# Patient Record
Sex: Female | Born: 1965 | Race: Black or African American | Hispanic: No | Smoking: Current every day smoker
Health system: Southern US, Community
[De-identification: ages and names within clinical notes are randomized; demographics above are authoritative.]

## PROBLEM LIST (undated history)

## (undated) DIAGNOSIS — J45909 Unspecified asthma, uncomplicated: Secondary | ICD-10-CM

## (undated) DIAGNOSIS — I10 Essential (primary) hypertension: Secondary | ICD-10-CM

## (undated) DIAGNOSIS — J329 Chronic sinusitis, unspecified: Secondary | ICD-10-CM

## (undated) DIAGNOSIS — E7800 Pure hypercholesterolemia, unspecified: Secondary | ICD-10-CM

## (undated) HISTORY — PX: KNEE SURGERY: SHX244

## (undated) HISTORY — PX: TUBAL LIGATION: SHX77

## (undated) HISTORY — PX: NASAL SINUS SURGERY: SHX719

---

## 2005-06-12 ENCOUNTER — Emergency Department (HOSPITAL_COMMUNITY): Admission: EM | Admit: 2005-06-12 | Discharge: 2005-06-12 | Payer: Self-pay | Admitting: Emergency Medicine

## 2006-01-14 ENCOUNTER — Emergency Department (HOSPITAL_COMMUNITY): Admission: EM | Admit: 2006-01-14 | Discharge: 2006-01-14 | Payer: Self-pay | Admitting: Emergency Medicine

## 2006-03-04 ENCOUNTER — Emergency Department (HOSPITAL_COMMUNITY): Admission: EM | Admit: 2006-03-04 | Discharge: 2006-03-04 | Payer: Self-pay | Admitting: Emergency Medicine

## 2006-05-14 IMAGING — CT CT ABDOMEN W/ CM
2 of 7 series · 15 of 44 positions shown, 19 images · IV contrast (ORAL OMNI 350 & 100 ML OMNI 300)
Comparison: none

CLINICAL DATA: Severe left lower quadrant pain with leukocytosis, nausea and vomiting.
 ABDOMEN CT WITH CONTRAST:
TECHNIQUE: Multidetector CT imaging of the abdomen was performed following the standard protocol during bolus administration of intravenous contrast.
 Contrast:  100 cc Omnipaque 300.  Oral contrast was given.
TECHNIQUE: Multidetector CT imaging of the pelvis was performed following the standard protocol during bolus administration of intravenous contrast.
 No pelvic mass, fluid collection, or inflammatory process is seen.  Air extends into the distal colon, and there is no evidence of diverticulitis or appendicitis.  The uterus and adnexa appear unremarkable.

[Series 4: recon 3: routine abdomen · axial · 0.73mm/px · z∈[-448,-68]mm · 12 of 352 slices shown, 16 images]
[im 32/352  soft-tissue]
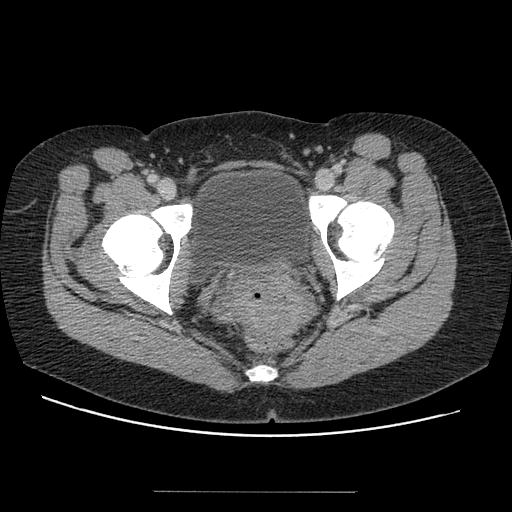
[im 32/352  bone]
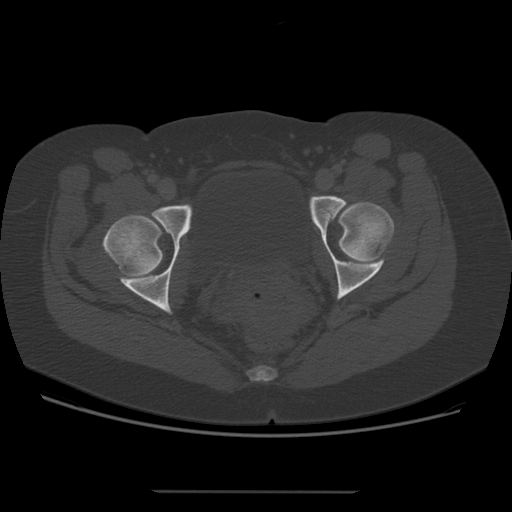
[im 64/352  soft-tissue]
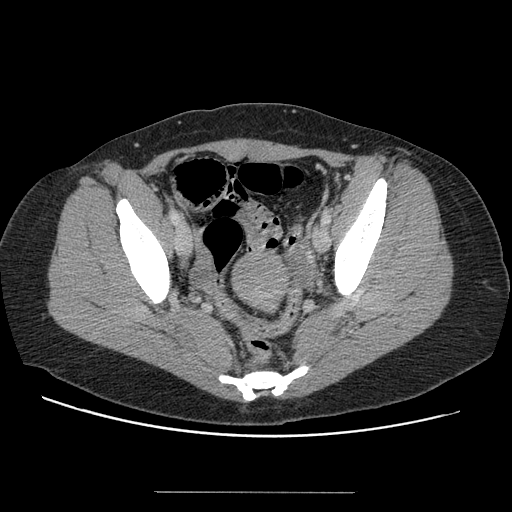
[im 96/352  soft-tissue]
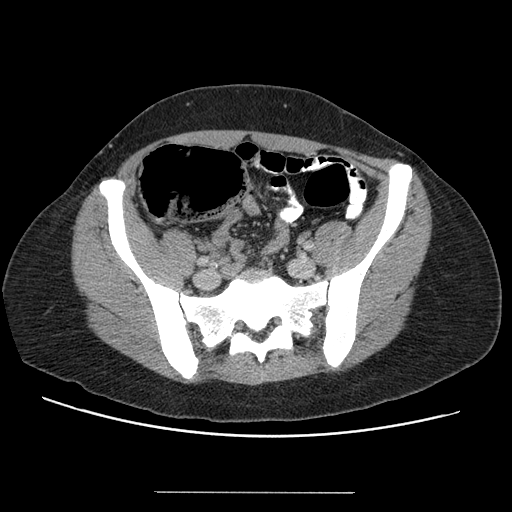
[im 128/352  soft-tissue]
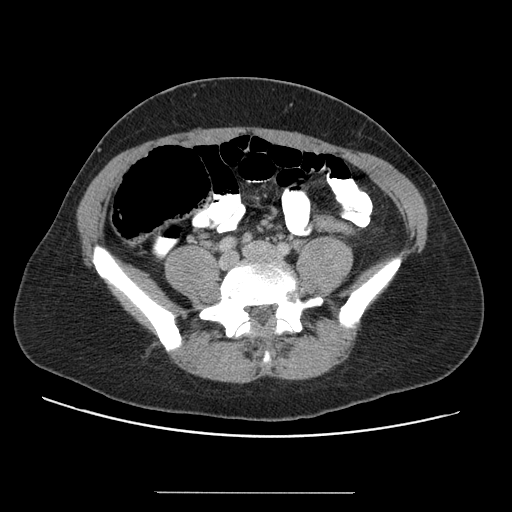
[im 160/352  soft-tissue]
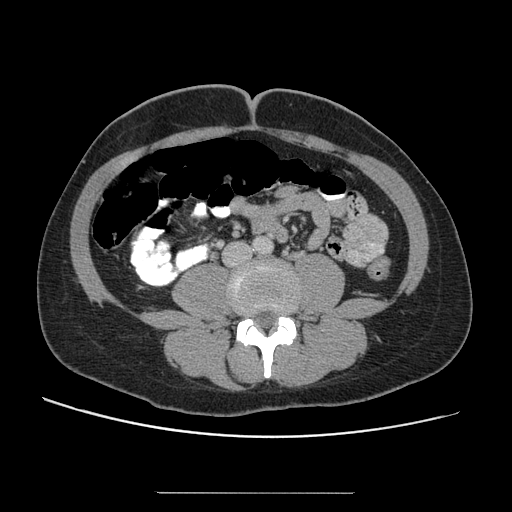
[im 192/352  soft-tissue]
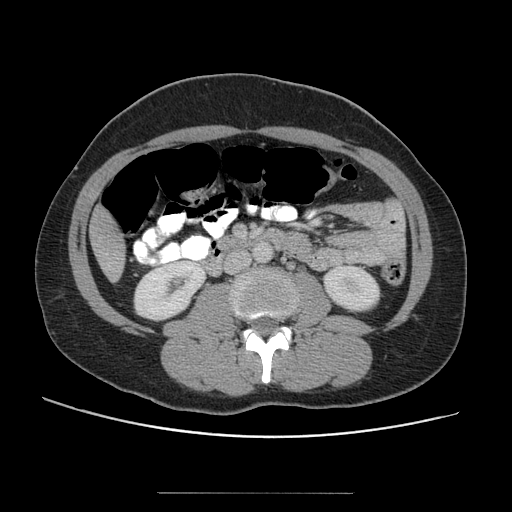
[im 224/352  soft-tissue]
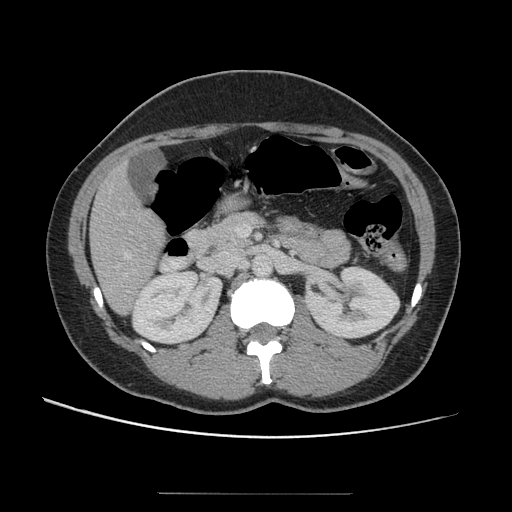
[im 256/352  soft-tissue]
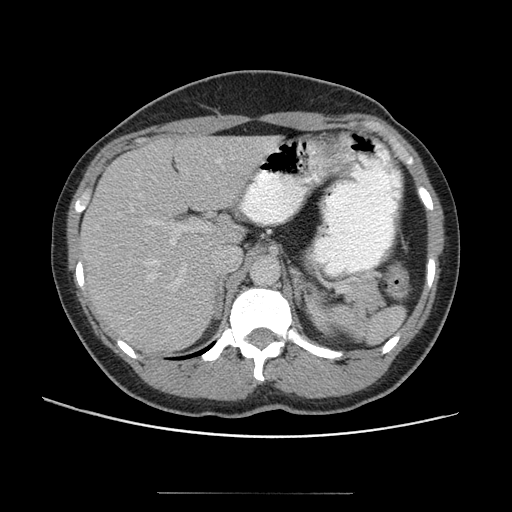
[im 288/352  soft-tissue]
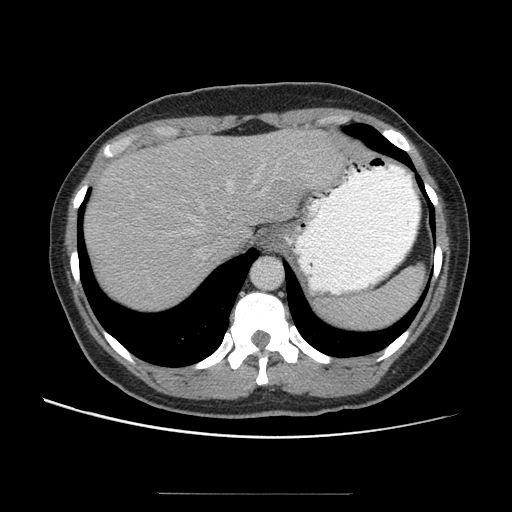
[im 288/352  lung]
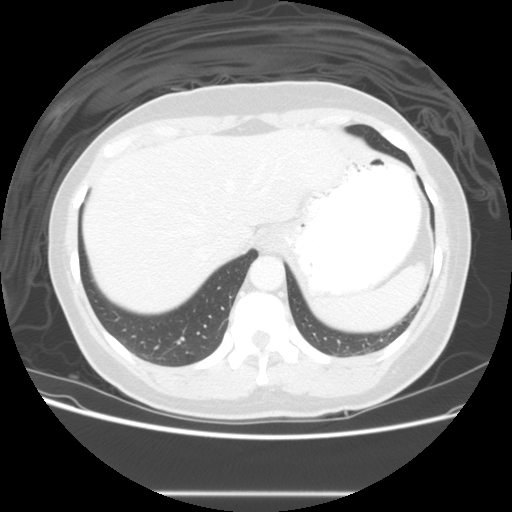
[im 288/352  bone]
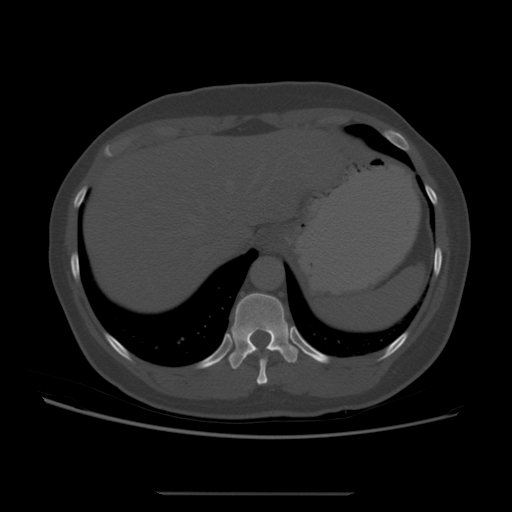
[im 304/352  lung]
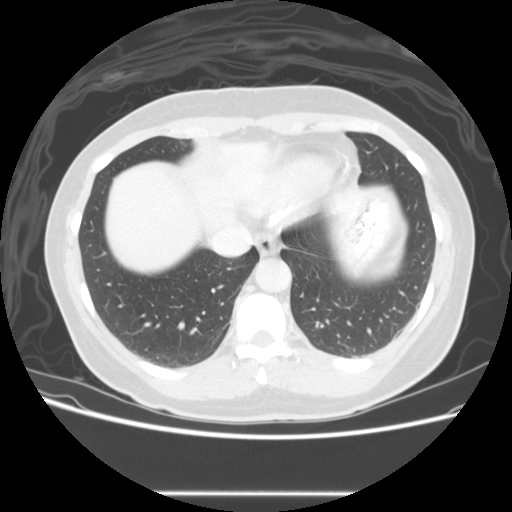
[im 320/352  soft-tissue]
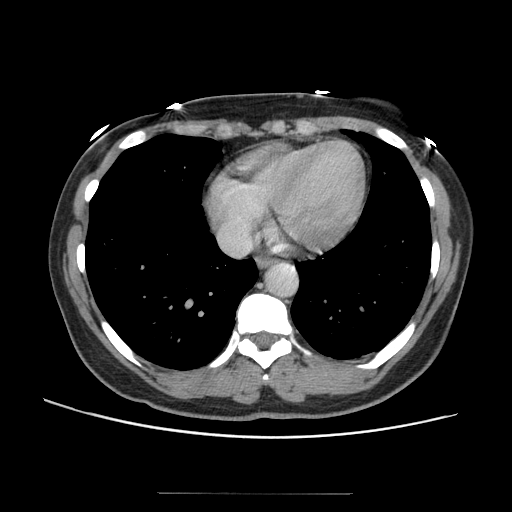
[im 320/352  lung]
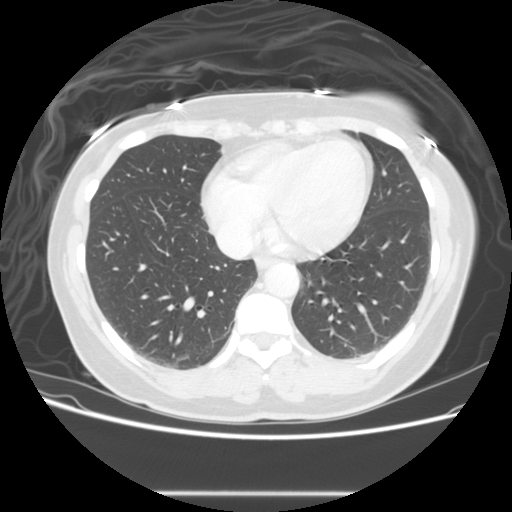
[im 336/352  lung]
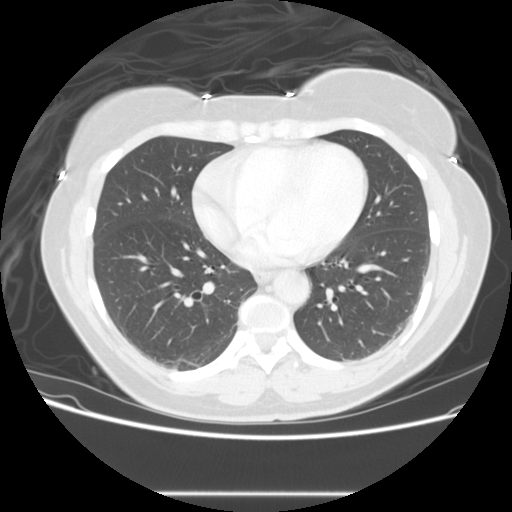

[Series 401: reformatted · coronal · 0.27mm/px · 3 of 49 slices shown]
[im 17/49  soft-tissue]
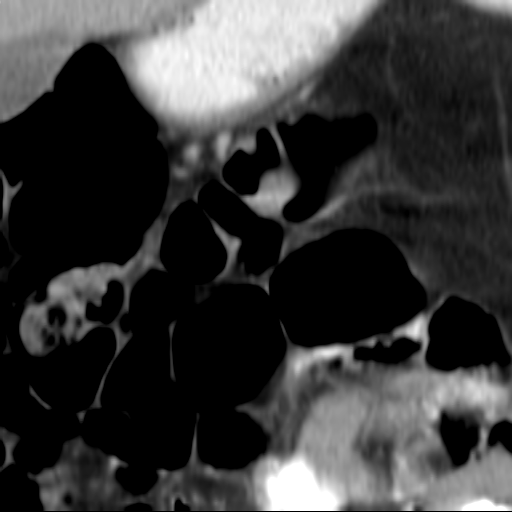
[im 22/49  soft-tissue]
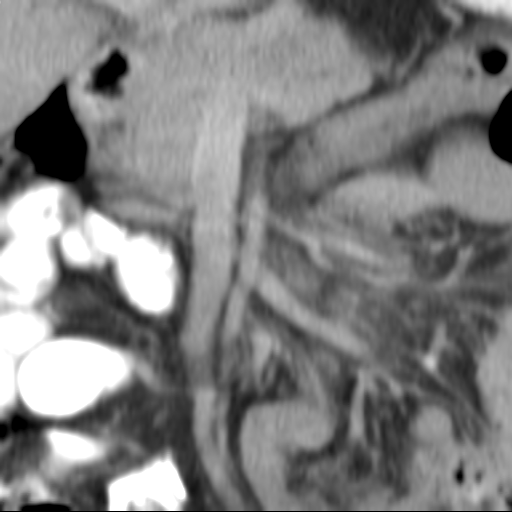
[im 27/49  soft-tissue]
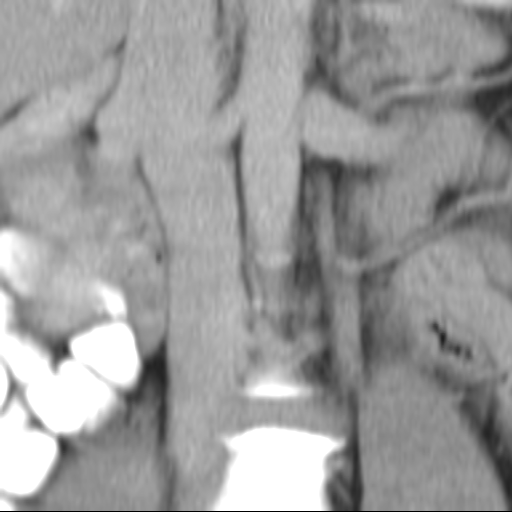

[15 of 44 positions shown; findings below may reference images not displayed]

FINDINGS: The lung bases are clear.  There is no pleural effusion.  The liver, spleen, gallbladder, pancreas, adrenal glands and kidneys appear normal.  Air is present throughout the colon.  The bowel gas pattern is nonobstructive.  There is no free intraperitoneal air, ascites, or inflammatory change.
IMPRESSION: No acute abdominal findings.  Bowel gas pattern is nonobstructive. 
 PELVIS CT WITH CONTRAST:
IMPRESSION: No acute pelvic findings.  No evidence of pelvic abscess or focal inflammatory process.

## 2006-08-07 ENCOUNTER — Emergency Department (HOSPITAL_COMMUNITY): Admission: EM | Admit: 2006-08-07 | Discharge: 2006-08-07 | Payer: Self-pay | Admitting: Emergency Medicine

## 2013-04-16 ENCOUNTER — Emergency Department (HOSPITAL_COMMUNITY)
Admission: EM | Admit: 2013-04-16 | Discharge: 2013-04-16 | Disposition: A | Discharge status: Home / Self Care | Payer: Self-pay | Attending: Emergency Medicine | Admitting: Emergency Medicine

## 2013-04-16 ENCOUNTER — Encounter (HOSPITAL_COMMUNITY): Payer: Self-pay | Admitting: Emergency Medicine

## 2013-04-16 DIAGNOSIS — H9209 Otalgia, unspecified ear: Secondary | ICD-10-CM | POA: Insufficient documentation

## 2013-04-16 DIAGNOSIS — J329 Chronic sinusitis, unspecified: Secondary | ICD-10-CM | POA: Insufficient documentation

## 2013-04-16 DIAGNOSIS — Z9889 Other specified postprocedural states: Secondary | ICD-10-CM | POA: Insufficient documentation

## 2013-04-16 DIAGNOSIS — E119 Type 2 diabetes mellitus without complications: Secondary | ICD-10-CM | POA: Insufficient documentation

## 2013-04-16 DIAGNOSIS — J3489 Other specified disorders of nose and nasal sinuses: Secondary | ICD-10-CM | POA: Insufficient documentation

## 2013-04-16 DIAGNOSIS — Z862 Personal history of diseases of the blood and blood-forming organs and certain disorders involving the immune mechanism: Secondary | ICD-10-CM | POA: Insufficient documentation

## 2013-04-16 DIAGNOSIS — R059 Cough, unspecified: Secondary | ICD-10-CM | POA: Insufficient documentation

## 2013-04-16 DIAGNOSIS — Z8639 Personal history of other endocrine, nutritional and metabolic disease: Secondary | ICD-10-CM | POA: Insufficient documentation

## 2013-04-16 DIAGNOSIS — R51 Headache: Secondary | ICD-10-CM | POA: Insufficient documentation

## 2013-04-16 DIAGNOSIS — R05 Cough: Secondary | ICD-10-CM | POA: Insufficient documentation

## 2013-04-16 DIAGNOSIS — F17200 Nicotine dependence, unspecified, uncomplicated: Secondary | ICD-10-CM | POA: Insufficient documentation

## 2013-04-16 HISTORY — DX: Chronic sinusitis, unspecified: J32.9

## 2013-04-16 HISTORY — DX: Pure hypercholesterolemia, unspecified: E78.00

## 2013-04-16 MED ORDER — PRAVASTATIN SODIUM 10 MG PO TABS: 10.0000 mg | ORAL_TABLET | Freq: Every day | ORAL | Status: DC

## 2013-04-16 MED ORDER — HYDROCODONE-ACETAMINOPHEN 5-325 MG PO TABS: 1.0000 | ORAL_TABLET | Freq: Four times a day (QID) | ORAL | Status: DC | PRN

## 2013-04-16 MED ORDER — HYDROCODONE-ACETAMINOPHEN 5-325 MG PO TABS
ORAL_TABLET | ORAL | Status: AC
  Filled 2013-04-16: qty 1

## 2013-04-16 MED ORDER — HYDROCHLOROTHIAZIDE 25 MG PO TABS: 25.0000 mg | ORAL_TABLET | Freq: Every day | ORAL | Status: DC

## 2013-04-16 MED ORDER — AMOXICILLIN 500 MG PO CAPS: 500.0000 mg | ORAL_CAPSULE | Freq: Three times a day (TID) | ORAL | Status: DC

## 2013-04-16 MED ORDER — HYDROCODONE-ACETAMINOPHEN 5-325 MG PO TABS
1.0000 | ORAL_TABLET | Freq: Once | ORAL | Status: AC
  Administered 2013-04-16: 1 via ORAL

## 2013-04-16 NOTE — ED Provider Notes (Signed)
History    This chart was scribed for Benny Lennert, MD by Gerlean Ren, ED Scribe. This patient was seen in room [facility] and the patient's care was started at 5:22 PM    CSN: [id number]  Arrival date & time 04/16/13  1653   First MD Initiated Contact with Patient 04/16/13 1716      Chief Complaint  Patient presents with  . Recurrent Sinusitis     The history is provided by the patient. No language interpreter was used.  Jocelyn Perry is a 47 y.o. female with h/o DM who presents to the Emergency Department complaining of a sinus infection with sinus pressure, HA, left side otalgia, cough, rhinorrhea.  Pt reports h/o recurrent sinus infections since having surgery on sinuses many years ago.  Pt reports most recent sinus infection 4 months ago but is unsure of what antibiotic she was given.  Pt denies associated fever, sneezing.  Pt is a current everyday smoker (0.5pack/day).  Past Medical History  Diagnosis Date  . Sinus infection   . Diabetes mellitus without complication   . High cholesterol     Past Surgical History  Procedure Laterality Date  . Knee surgery Right   . Tubal ligation    . Nasal sinus surgery      Family History  Problem Relation Age of Onset  . Diabetes Mother   . Cancer Mother   . Hypertension Father   . Stroke Sister     History  Substance Use Topics  . Smoking status: Current Every Day Smoker -- 0.50 packs/day for 12 years    Types: Cigarettes  . Smokeless tobacco: Never Used  . Alcohol Use: No    OB History   Grav Para Term Preterm Abortions TAB SAB Ect Mult Living   3 3 3       3       Review of Systems  Constitutional: Negative for fever.  HENT: Positive for ear pain and sinus pressure. Negative for sneezing.   Respiratory: Positive for cough.   Neurological: Positive for headaches.  All other systems reviewed and are negative.    Allergies  Review of patient's allergies indicates no known allergies.  Home Medications   No current outpatient prescriptions on file.  BP 162/110  Pulse 102  Temp(Src) 98.3 F (36.8 C) (Oral)  Resp 18  Ht 5\' 7"  (1.702 m)  Wt 182 lb (82.555 kg)  BMI 28.5 kg/m2  SpO2 100%  Physical Exam  Nursing note and vitals reviewed. Constitutional: She is oriented to person, place, and time. She appears well-developed.  HENT:  Head: Normocephalic.  Tenderness over left maxillary sinus  Eyes: Conjunctivae are normal.  Neck: No tracheal deviation present.  Cardiovascular:  No murmur heard. Pulmonary/Chest: Effort normal. No respiratory distress.  Musculoskeletal: Normal range of motion.  Neurological: She is oriented to person, place, and time.  Skin: Skin is warm.  Psychiatric: She has a normal mood and affect.    ED Course  Procedures (including critical care time) DIAGNOSTIC STUDIES: Oxygen Saturation is 100% on room air, normal by my interpretation.    COORDINATION OF CARE: 5:25 PM- Patient informed of clinical course, understands medical decision-making process, and agrees with plan.  Labs Reviewed - No data to display No results found.   No diagnosis found.    MDM        The chart was scribed for me under my direct supervision.  I personally performed the history, physical, and medical decision  making and all procedures in the evaluation of this patient.Benny Lennert, MD 04/16/13 [phone]

## 2013-04-16 NOTE — ED Notes (Addendum)
Patient c/o sinus infection. Per patient has had multiple sinus infections in past with surgery on sinus. Patient reports sinus pressure, headache, and left ear.

## 2013-04-16 NOTE — ED Notes (Signed)
Instructions, prescriptions and f/u information given/reviewed - verbalizes understanding.  

## 2014-10-06 ENCOUNTER — Encounter (HOSPITAL_COMMUNITY): Payer: Self-pay | Admitting: Emergency Medicine

## 2016-10-07 ENCOUNTER — Encounter: Payer: Self-pay | Admitting: Emergency Medicine

## 2016-10-07 ENCOUNTER — Emergency Department
Admission: EM | Admit: 2016-10-07 | Discharge: 2016-10-07 | Disposition: A | Discharge status: Home / Self Care | Payer: Commercial Managed Care - HMO | Attending: Emergency Medicine | Admitting: Emergency Medicine

## 2016-10-07 DIAGNOSIS — F17210 Nicotine dependence, cigarettes, uncomplicated: Secondary | ICD-10-CM | POA: Diagnosis not present

## 2016-10-07 DIAGNOSIS — J0100 Acute maxillary sinusitis, unspecified: Secondary | ICD-10-CM | POA: Diagnosis not present

## 2016-10-07 DIAGNOSIS — E119 Type 2 diabetes mellitus without complications: Secondary | ICD-10-CM | POA: Diagnosis not present

## 2016-10-07 DIAGNOSIS — I10 Essential (primary) hypertension: Secondary | ICD-10-CM | POA: Insufficient documentation

## 2016-10-07 DIAGNOSIS — Z79899 Other long term (current) drug therapy: Secondary | ICD-10-CM | POA: Insufficient documentation

## 2016-10-07 DIAGNOSIS — Z792 Long term (current) use of antibiotics: Secondary | ICD-10-CM | POA: Insufficient documentation

## 2016-10-07 DIAGNOSIS — R0981 Nasal congestion: Secondary | ICD-10-CM | POA: Diagnosis present

## 2016-10-07 MED ORDER — LEVOFLOXACIN 750 MG PO TABS: 750.0000 mg | ORAL_TABLET | Freq: Every day | ORAL | 0 refills | Status: AC

## 2016-10-07 MED ORDER — HYDROCOD POLST-CPM POLST ER 10-8 MG/5ML PO SUER: 5.0000 mL | Freq: Every evening | ORAL | 0 refills | Status: DC | PRN

## 2016-10-07 MED ORDER — FEXOFENADINE-PSEUDOEPHED ER 60-120 MG PO TB12: 1.0000 | ORAL_TABLET | Freq: Two times a day (BID) | ORAL | 0 refills | Status: DC

## 2016-10-07 MED ORDER — BENZONATATE 100 MG PO CAPS: 100.0000 mg | ORAL_CAPSULE | Freq: Three times a day (TID) | ORAL | 0 refills | Status: DC | PRN

## 2016-10-07 NOTE — ED Provider Notes (Signed)
[facility] Emergency Department Provider Note   ____________________________________________   First MD Initiated Contact with Patient 10/07/16 [phone]     (approximate)  I have reviewed the triage vital signs and the nursing notes.   HISTORY  Chief Complaint URI    HPI Jocelyn Perry is a 50 y.o. female patient complain of 1 week of nasal congestion and left ear pain. Patient states she has a history of recurrent sinus infections. Patient denies any fever or chills at this time. Patient states  fever and chills over the weekend but they have resolved.  Patient rates her pain discomfort as 8/10.Patient states she has a thick nasal discharge and postnasal drainage. Patient said the postnasal drainage is causing a nonproductive cough. Patient states cough is worse at night. No palliative measures taken for this complaint.  Past Medical History:  Diagnosis Date  . Diabetes mellitus without complication (HCC)   . High cholesterol   . Sinus infection     There are no active problems to display for this patient.   Past Surgical History:  Procedure Laterality Date  . KNEE SURGERY Right   . NASAL SINUS SURGERY    . TUBAL LIGATION      Prior to Admission medications   Medication Sig Start Date End Date Taking? Authorizing Provider  amoxicillin (AMOXIL) 500 MG capsule Take 1 capsule (500 mg total) by mouth 3 (three) times daily. 04/16/13   Bethann BerkshireJoseph Zammit, MD  benzonatate (TESSALON PERLES) 100 MG capsule Take 1 capsule (100 mg total) by mouth 3 (three) times daily as needed for cough. 10/07/16 10/07/17  Joni Reiningonald K Smith, PA-C  chlorpheniramine-HYDROcodone [facility](TUSSIONEX PENNKINETIC ER) 10-8 MG/5ML SUER Take 5 mLs by mouth at bedtime as needed for cough. 10/07/16   Joni Reiningonald K Smith, PA-C  fexofenadine-pseudoephedrine (ALLEGRA-D) 60-120 MG 12 hr tablet Take 1 tablet by mouth 2 (two) times daily. 10/07/16   Joni Reiningonald K Smith, PA-C  hydrochlorothiazide (HYDRODIURIL) 25 MG  tablet Take 1 tablet (25 mg total) by mouth daily. 04/16/13   Bethann BerkshireJoseph Zammit, MD  hydrochlorothiazide (MICROZIDE) 12.5 MG capsule Take 12.5 mg by mouth daily.    Historical Provider, MD  HYDROcodone-acetaminophen (NORCO/VICODIN) 5-325 MG per tablet Take 1 tablet by mouth every 6 (six) hours as needed for pain. 04/16/13   Bethann BerkshireJoseph Zammit, MD  ibuprofen (ADVIL,MOTRIN) 200 MG tablet Take 200 mg by mouth every 6 (six) hours as needed for pain.    Historical Provider, MD  levofloxacin (LEVAQUIN) 750 MG tablet Take 1 tablet (750 mg total) by mouth daily. 10/07/16 10/14/16  Joni Reiningonald K Smith, PA-C  pravastatin (PRAVACHOL) 10 MG tablet Take 1 tablet (10 mg total) by mouth daily. 04/16/13   Bethann BerkshireJoseph Zammit, MD    Allergies Review of patient's allergies indicates no known allergies.  Family History  Problem Relation Age of Onset  . Diabetes Mother   . Cancer Mother   . Hypertension Father   . Stroke Sister     Social History Social History  Substance Use Topics  . Smoking status: Current Every Day Smoker    Packs/day: 0.50    Years: 12.00    Types: Cigarettes  . Smokeless tobacco: Never Used  . Alcohol use No    Review of Systems Constitutional: No fever/chills Eyes: No visual changes. ENT: No sore throat. Cardiovascular: Denies chest pain. Respiratory: Denies shortness of breath. Gastrointestinal: No abdominal pain.  No nausea, no vomiting.  No diarrhea.  No constipation. Genitourinary: Negative for dysuria. Musculoskeletal: Negative for  back pain. Skin: Negative for rash. Neurological: Negative for headaches, focal weakness or numbness. Endocrine:Hypertension  ____________________________________________   PHYSICAL EXAM:  VITAL SIGNS: ED Triage Vitals [10/07/16 0818]  Enc Vitals Group     BP (!) 177/106     Pulse Rate 94     Resp 18     Temp 98.2 F (36.8 C)     Temp Source Oral     SpO2 99 %     Weight 185 lb (83.9 kg)     Height 5\' 7"  (1.702 m)     Head Circumference       Peak Flow      Pain Score 8     Pain Loc      Pain Edu?      Excl. in GC?     Constitutional: Alert and oriented. Well appearing and in no acute distress. Eyes: Conjunctivae are normal. PERRL. EOMI. Head: Atraumatic. Nose: Left maxillary guarding. Edematous nasal turbinates with thick nasal discharge. Patient is edematous bilateral TMs. Mouth/Throat: Mucous membranes are moist.  Oropharynx non-erythematous. Postnasal drainage Neck: No stridor.  No cervical spine tenderness to palpation. Hematological/Lymphatic/Immunilogical: No cervical lymphadenopathy. Cardiovascular: Normal rate, regular rhythm. Grossly normal heart sounds.  Good peripheral circulation. Elevated blood pressure but admits she did not take a blood pressure medicine this morning. Respiratory: Normal respiratory effort.  No retractions. Lungs CTAB. Gastrointestinal: Soft and nontender. No distention. No abdominal bruits. No CVA tenderness. Musculoskeletal: No lower extremity tenderness nor edema.  No joint effusions. Neurologic:  Normal speech and language. No gross focal neurologic deficits are appreciated. No gait instability. Skin:  Skin is warm, dry and intact. No rash noted. Psychiatric: Mood and affect are normal. Speech and behavior are normal.  ____________________________________________   LABS (all labs ordered are listed, but only abnormal results are displayed)  Labs Reviewed - No data to display ____________________________________________  EKG   ____________________________________________  RADIOLOGY   ____________________________________________   PROCEDURES  Procedure(s) performed: None  Procedures  Critical Care performed: No  ____________________________________________   INITIAL IMPRESSION / ASSESSMENT AND PLAN / ED COURSE  Pertinent labs & imaging results that were available during my care of the patient were reviewed by me and considered in my medical decision making (see chart  for details).  Left maxillary sinusitis. Patient given discharge Instructions. Patient given a prescription for Levaquin, Allegra-D, Tessalon, and Tussionex.  Clinical Course     ____________________________________________   FINAL CLINICAL IMPRESSION(S) / ED DIAGNOSES  Final diagnoses:  Subacute maxillary sinusitis      NEW MEDICATIONS STARTED DURING THIS VISIT:  New Prescriptions   BENZONATATE (TESSALON PERLES) 100 MG CAPSULE    Take 1 capsule (100 mg total) by mouth 3 (three) times daily as needed for cough.   CHLORPHENIRAMINE-HYDROCODONE (TUSSIONEX PENNKINETIC ER) 10-8 MG/5ML SUER    Take 5 mLs by mouth at bedtime as needed for cough.   FEXOFENADINE-PSEUDOEPHEDRINE (ALLEGRA-D) 60-120 MG 12 HR TABLET    Take 1 tablet by mouth 2 (two) times daily.   LEVOFLOXACIN (LEVAQUIN) 750 MG TABLET    Take 1 tablet (750 mg total) by mouth daily.     Note:  This document was prepared using Dragon voice recognition software and may include unintentional dictation errors.    Joni Reiningonald K Smith, PA-C 10/07/16 [phone]    Jeanmarie PlantJames A McShane, MD 10/07/16 [phone]

## 2016-10-07 NOTE — ED Notes (Signed)
Pt to ed with c/o pressure and drainage from nose and throat x 3 days.  Pt states she has tried otcs without relief. Denies fever. Reports cough worse at night.

## 2016-10-07 NOTE — ED Triage Notes (Signed)
Pt presents with nasal congestion and ear pain for over one week. Pt states she gets frequent sinus infection.

## 2016-11-06 ENCOUNTER — Emergency Department: Payer: Commercial Managed Care - HMO | Admitting: Anesthesiology

## 2016-11-06 ENCOUNTER — Observation Stay
Admission: EM | Admit: 2016-11-06 | Discharge: 2016-11-07 | Disposition: A | Discharge status: Home / Self Care | Payer: Commercial Managed Care - HMO | Attending: Surgery | Admitting: Surgery

## 2016-11-06 ENCOUNTER — Emergency Department: Payer: Commercial Managed Care - HMO

## 2016-11-06 ENCOUNTER — Encounter: Payer: Self-pay | Admitting: Emergency Medicine

## 2016-11-06 ENCOUNTER — Other Ambulatory Visit: Payer: Self-pay

## 2016-11-06 ENCOUNTER — Encounter
Admission: EM | Disposition: A | Discharge status: Home / Self Care | Payer: Self-pay | Source: Home / Self Care | Attending: Emergency Medicine

## 2016-11-06 DIAGNOSIS — K352 Acute appendicitis with generalized peritonitis, without abscess: Secondary | ICD-10-CM | POA: Diagnosis present

## 2016-11-06 DIAGNOSIS — F17210 Nicotine dependence, cigarettes, uncomplicated: Secondary | ICD-10-CM | POA: Insufficient documentation

## 2016-11-06 DIAGNOSIS — E119 Type 2 diabetes mellitus without complications: Secondary | ICD-10-CM | POA: Insufficient documentation

## 2016-11-06 DIAGNOSIS — Z833 Family history of diabetes mellitus: Secondary | ICD-10-CM | POA: Insufficient documentation

## 2016-11-06 DIAGNOSIS — Z808 Family history of malignant neoplasm of other organs or systems: Secondary | ICD-10-CM | POA: Insufficient documentation

## 2016-11-06 DIAGNOSIS — J45909 Unspecified asthma, uncomplicated: Secondary | ICD-10-CM

## 2016-11-06 DIAGNOSIS — I700 Atherosclerosis of aorta: Secondary | ICD-10-CM | POA: Insufficient documentation

## 2016-11-06 DIAGNOSIS — K35209 Acute appendicitis with generalized peritonitis, without abscess, unspecified as to perforation: Secondary | ICD-10-CM | POA: Diagnosis present

## 2016-11-06 DIAGNOSIS — Z823 Family history of stroke: Secondary | ICD-10-CM | POA: Insufficient documentation

## 2016-11-06 DIAGNOSIS — K353 Acute appendicitis with localized peritonitis, without perforation or gangrene: Secondary | ICD-10-CM

## 2016-11-06 DIAGNOSIS — E7800 Pure hypercholesterolemia, unspecified: Secondary | ICD-10-CM | POA: Insufficient documentation

## 2016-11-06 DIAGNOSIS — Z8249 Family history of ischemic heart disease and other diseases of the circulatory system: Secondary | ICD-10-CM | POA: Insufficient documentation

## 2016-11-06 DIAGNOSIS — K3580 Unspecified acute appendicitis: Secondary | ICD-10-CM | POA: Diagnosis present

## 2016-11-06 DIAGNOSIS — K5730 Diverticulosis of large intestine without perforation or abscess without bleeding: Secondary | ICD-10-CM | POA: Insufficient documentation

## 2016-11-06 DIAGNOSIS — I10 Essential (primary) hypertension: Secondary | ICD-10-CM | POA: Insufficient documentation

## 2016-11-06 HISTORY — DX: Essential (primary) hypertension: I10

## 2016-11-06 HISTORY — PX: LAPAROSCOPIC APPENDECTOMY: SHX408

## 2016-11-06 HISTORY — DX: Unspecified asthma, uncomplicated: J45.909

## 2016-11-06 LAB — URINALYSIS COMPLETE WITH MICROSCOPIC (ARMC ONLY)
Bilirubin Urine: NEGATIVE
Glucose, UA: NEGATIVE mg/dL
Hgb urine dipstick: NEGATIVE
KETONES UR: NEGATIVE mg/dL
Leukocytes, UA: NEGATIVE
Nitrite: NEGATIVE
PROTEIN: NEGATIVE mg/dL
Specific Gravity, Urine: 1.018 (ref 1.005–1.030)
pH: 5 (ref 5.0–8.0)

## 2016-11-06 LAB — COMPREHENSIVE METABOLIC PANEL
ALBUMIN: 4.4 g/dL (ref 3.5–5.0)
ALK PHOS: 121 U/L (ref 38–126)
ALT: 14 U/L (ref 14–54)
ANION GAP: 9 (ref 5–15)
AST: 19 U/L (ref 15–41)
BUN: 10 mg/dL (ref 6–20)
CALCIUM: 9.7 mg/dL (ref 8.9–10.3)
CO2: 26 mmol/L (ref 22–32)
CREATININE: 0.87 mg/dL (ref 0.44–1.00)
Chloride: 103 mmol/L (ref 101–111)
GFR calc Af Amer: >60 mL/min (ref >60)
GFR calc non Af Amer: >60 mL/min (ref >60)
GLUCOSE: 108 mg/dL — AB (ref 65–99)
Potassium: 4.2 mmol/L (ref 3.5–5.1)
SODIUM: 138 mmol/L (ref 135–145)
Total Bilirubin: 0.7 mg/dL (ref 0.3–1.2)
Total Protein: 8.4 g/dL — ABNORMAL HIGH (ref 6.5–8.1)

## 2016-11-06 LAB — LACTIC ACID, PLASMA: LACTIC ACID, VENOUS: 1.2 mmol/L (ref 0.5–1.9)

## 2016-11-06 LAB — CBC
HCT: 50 % — ABNORMAL HIGH (ref 35.0–47.0)
HEMOGLOBIN: 16.3 g/dL — AB (ref 12.0–16.0)
MCH: 27.3 pg (ref 26.0–34.0)
MCHC: 32.7 g/dL (ref 32.0–36.0)
MCV: 83.4 fL (ref 80.0–100.0)
Platelets: 382 10*3/uL (ref 150–440)
RBC: 5.99 MIL/uL — ABNORMAL HIGH (ref 3.80–5.20)
RDW: 14.1 % (ref 11.5–14.5)
WBC: 18.1 10*3/uL — ABNORMAL HIGH (ref 3.6–11.0)

## 2016-11-06 LAB — TROPONIN I

## 2016-11-06 LAB — LIPASE, BLOOD: Lipase: 19 U/L (ref 11–51)

## 2016-11-06 SURGERY — APPENDECTOMY, LAPAROSCOPIC
Anesthesia: General | Wound class: Clean Contaminated

## 2016-11-06 MED ORDER — MORPHINE SULFATE (PF) 4 MG/ML IV SOLN
4.0000 mg | Freq: Once | INTRAVENOUS | Status: AC
  Administered 2016-11-06: 4 mg via INTRAVENOUS
  Filled 2016-11-06: qty 1

## 2016-11-06 MED ORDER — SUCCINYLCHOLINE CHLORIDE 20 MG/ML IJ SOLN
INTRAMUSCULAR | Status: DC | PRN
  Administered 2016-11-06: 100 mg via INTRAVENOUS

## 2016-11-06 MED ORDER — PIPERACILLIN-TAZOBACTAM 3.375 G IVPB
INTRAVENOUS | Status: AC
  Administered 2016-11-06: 3.375 g via INTRAVENOUS
  Filled 2016-11-06: qty 50

## 2016-11-06 MED ORDER — SODIUM CHLORIDE 0.9 % IV BOLUS (SEPSIS)
1000.0000 mL | INTRAVENOUS | Status: AC
  Administered 2016-11-06: 1000 mL via INTRAVENOUS

## 2016-11-06 MED ORDER — ONDANSETRON HCL 4 MG/2ML IJ SOLN: 4.0000 mg | Freq: Once | INTRAMUSCULAR | Status: DC | PRN

## 2016-11-06 MED ORDER — ROCURONIUM BROMIDE 100 MG/10ML IV SOLN
INTRAVENOUS | Status: DC | PRN
  Administered 2016-11-06: 30 mg via INTRAVENOUS

## 2016-11-06 MED ORDER — LIDOCAINE HCL (CARDIAC) 20 MG/ML IV SOLN
INTRAVENOUS | Status: DC | PRN
  Administered 2016-11-06: 50 mg via INTRAVENOUS

## 2016-11-06 MED ORDER — BUPIVACAINE HCL (PF) 0.5 % IJ SOLN
INTRAMUSCULAR | Status: AC
  Filled 2016-11-06: qty 30

## 2016-11-06 MED ORDER — MORPHINE SULFATE (PF) 4 MG/ML IV SOLN
INTRAVENOUS | Status: AC
  Administered 2016-11-06: 4 mg via INTRAVENOUS
  Filled 2016-11-06: qty 1

## 2016-11-06 MED ORDER — ONDANSETRON HCL 4 MG/2ML IJ SOLN
INTRAMUSCULAR | Status: DC | PRN
  Administered 2016-11-06: 4 mg via INTRAVENOUS

## 2016-11-06 MED ORDER — ACETAMINOPHEN 500 MG PO TABS
1000.0000 mg | ORAL_TABLET | Freq: Four times a day (QID) | ORAL | Status: DC
  Administered 2016-11-06 – 2016-11-07 (×2): 1000 mg via ORAL
  Filled 2016-11-06 (×2): qty 2

## 2016-11-06 MED ORDER — KETOROLAC TROMETHAMINE 30 MG/ML IJ SOLN
30.0000 mg | Freq: Four times a day (QID) | INTRAMUSCULAR | Status: DC
  Administered 2016-11-06 – 2016-11-07 (×2): 30 mg via INTRAVENOUS
  Filled 2016-11-06 (×2): qty 1

## 2016-11-06 MED ORDER — PIPERACILLIN-TAZOBACTAM 3.375 G IVPB 30 MIN
3.3750 g | Freq: Once | INTRAVENOUS | Status: AC
  Administered 2016-11-06: 3.375 g via INTRAVENOUS

## 2016-11-06 MED ORDER — ONDANSETRON HCL 4 MG/2ML IJ SOLN: 4.0000 mg | Freq: Four times a day (QID) | INTRAMUSCULAR | Status: DC | PRN

## 2016-11-06 MED ORDER — OXYCODONE HCL 5 MG PO TABS: 5.0000 mg | ORAL_TABLET | ORAL | Status: DC | PRN

## 2016-11-06 MED ORDER — PROPOFOL 10 MG/ML IV BOLUS
INTRAVENOUS | Status: DC | PRN
  Administered 2016-11-06: 140 mg via INTRAVENOUS

## 2016-11-06 MED ORDER — BUPIVACAINE HCL (PF) 0.5 % IJ SOLN
INTRAMUSCULAR | Status: DC | PRN
  Administered 2016-11-06: 30 mL

## 2016-11-06 MED ORDER — HYDRALAZINE HCL 20 MG/ML IJ SOLN: 10.0000 mg | INTRAMUSCULAR | Status: DC | PRN

## 2016-11-06 MED ORDER — SODIUM CHLORIDE 0.9 % IV SOLN
Freq: Once | INTRAVENOUS | Status: AC
  Administered 2016-11-06: 19:00:00 via INTRAVENOUS

## 2016-11-06 MED ORDER — DEXTROSE-NACL 5-0.9 % IV SOLN
INTRAVENOUS | Status: DC
  Administered 2016-11-06 – 2016-11-07 (×2): via INTRAVENOUS

## 2016-11-06 MED ORDER — CYCLOBENZAPRINE HCL 10 MG PO TABS
10.0000 mg | ORAL_TABLET | Freq: Three times a day (TID) | ORAL | Status: DC
  Administered 2016-11-06: 10 mg via ORAL
  Filled 2016-11-06: qty 1

## 2016-11-06 MED ORDER — FENTANYL CITRATE (PF) 100 MCG/2ML IJ SOLN
INTRAMUSCULAR | Status: DC | PRN
  Administered 2016-11-06: 100 ug via INTRAVENOUS

## 2016-11-06 MED ORDER — IOPAMIDOL (ISOVUE-300) INJECTION 61%
100.0000 mL | Freq: Once | INTRAVENOUS | Status: AC | PRN
  Administered 2016-11-06: 100 mL via INTRAVENOUS
  Filled 2016-11-06: qty 100

## 2016-11-06 MED ORDER — FENTANYL CITRATE (PF) 100 MCG/2ML IJ SOLN
25.0000 ug | INTRAMUSCULAR | Status: DC | PRN
  Administered 2016-11-06 (×3): 25 ug via INTRAVENOUS

## 2016-11-06 MED ORDER — MIDAZOLAM HCL 2 MG/2ML IJ SOLN
INTRAMUSCULAR | Status: DC | PRN
  Administered 2016-11-06: 2 mg via INTRAVENOUS

## 2016-11-06 MED ORDER — MORPHINE SULFATE (PF) 4 MG/ML IV SOLN
4.0000 mg | Freq: Once | INTRAVENOUS | Status: AC
  Administered 2016-11-06: 4 mg via INTRAVENOUS

## 2016-11-06 MED ORDER — ALBUTEROL SULFATE (2.5 MG/3ML) 0.083% IN NEBU
3.0000 mL | INHALATION_SOLUTION | Freq: Four times a day (QID) | RESPIRATORY_TRACT | Status: DC | PRN
Start: 2016-11-06 — End: 2016-11-07

## 2016-11-06 MED ORDER — IOPAMIDOL (ISOVUE-300) INJECTION 61%
30.0000 mL | Freq: Once | INTRAVENOUS | Status: AC | PRN
  Administered 2016-11-06: 30 mL via ORAL
  Filled 2016-11-06: qty 30

## 2016-11-06 MED ORDER — MORPHINE SULFATE (PF) 4 MG/ML IV SOLN: 2.0000 mg | INTRAVENOUS | Status: DC | PRN

## 2016-11-06 MED ORDER — ONDANSETRON HCL 4 MG/2ML IJ SOLN
4.0000 mg | INTRAMUSCULAR | Status: AC
  Administered 2016-11-06: 4 mg via INTRAVENOUS
  Filled 2016-11-06: qty 2

## 2016-11-06 MED ORDER — FENTANYL CITRATE (PF) 100 MCG/2ML IJ SOLN
INTRAMUSCULAR | Status: AC
  Administered 2016-11-06: 25 ug via INTRAVENOUS
  Filled 2016-11-06: qty 2

## 2016-11-06 MED ORDER — PANTOPRAZOLE SODIUM 40 MG PO TBEC: 40.0000 mg | DELAYED_RELEASE_TABLET | Freq: Every day | ORAL | Status: DC

## 2016-11-06 MED ORDER — SUGAMMADEX SODIUM 500 MG/5ML IV SOLN
INTRAVENOUS | Status: DC | PRN
  Administered 2016-11-06: 200 mg via INTRAVENOUS

## 2016-11-06 MED ORDER — LACTATED RINGERS IV SOLN
INTRAVENOUS | Status: DC | PRN
  Administered 2016-11-06: 21:00:00 via INTRAVENOUS

## 2016-11-06 MED ORDER — ONDANSETRON 4 MG PO TBDP: 4.0000 mg | ORAL_TABLET | Freq: Four times a day (QID) | ORAL | Status: DC | PRN

## 2016-11-06 MED ORDER — HYDROCHLOROTHIAZIDE 12.5 MG PO CAPS: 12.5000 mg | ORAL_CAPSULE | Freq: Every day | ORAL | Status: DC

## 2016-11-06 MED ORDER — PIPERACILLIN-TAZOBACTAM 3.375 G IVPB 30 MIN
3.3750 g | Freq: Once | INTRAVENOUS | Status: AC
  Administered 2016-11-07: 3.375 g via INTRAVENOUS
  Filled 2016-11-06 (×2): qty 50

## 2016-11-06 SURGICAL SUPPLY — 41 items
ADH SKN CLS APL DERMABOND .7 (GAUZE/BANDAGES/DRESSINGS) ×1
CANISTER SUCT 1200ML W/VALVE (MISCELLANEOUS) ×3 IMPLANT
CHLORAPREP W/TINT 26ML (MISCELLANEOUS) ×3 IMPLANT
CUTTER FLEX LINEAR 45M (STAPLE) ×2 IMPLANT
DERMABOND ADVANCED (GAUZE/BANDAGES/DRESSINGS) ×2
DERMABOND ADVANCED .7 DNX12 (GAUZE/BANDAGES/DRESSINGS) IMPLANT
ELECT CAUTERY BLADE 6.4 (BLADE) ×3 IMPLANT
ELECT REM PT RETURN 9FT ADLT (ELECTROSURGICAL) ×3
ELECTRODE REM PT RTRN 9FT ADLT (ELECTROSURGICAL) ×1 IMPLANT
ENDOPOUCH RETRIEVER 10 (MISCELLANEOUS) ×3 IMPLANT
GLOVE PI ORTHOPRO 6.5 (GLOVE) ×6
GLOVE PI ORTHOPRO STRL 6.5 (GLOVE) ×1 IMPLANT
GOWN STRL REUS W/ TWL LRG LVL3 (GOWN DISPOSABLE) ×2 IMPLANT
GOWN STRL REUS W/TWL LRG LVL3 (GOWN DISPOSABLE) ×6
IRRIGATION STRYKERFLOW (MISCELLANEOUS) ×1 IMPLANT
IRRIGATOR STRYKERFLOW (MISCELLANEOUS) ×3
IV NS 1000ML (IV SOLUTION) ×3
IV NS 1000ML BAXH (IV SOLUTION) ×1 IMPLANT
KIT RM TURNOVER STRD PROC AR (KITS) ×3 IMPLANT
LABEL OR SOLS (LABEL) ×1 IMPLANT
LIQUID BAND (GAUZE/BANDAGES/DRESSINGS) ×3 IMPLANT
NDL HYPO 25X1 1.5 SAFETY (NEEDLE) ×1 IMPLANT
NEEDLE HYPO 25X1 1.5 SAFETY (NEEDLE) ×3 IMPLANT
NS IRRIG 500ML POUR BTL (IV SOLUTION) ×3 IMPLANT
PACK LAP CHOLECYSTECTOMY (MISCELLANEOUS) ×3 IMPLANT
PENCIL ELECTRO HAND CTR (MISCELLANEOUS) ×3 IMPLANT
RELOAD 45 VASCULAR/THIN (ENDOMECHANICALS) ×3 IMPLANT
RELOAD STAPLE 45 2.5 WHT GRN (ENDOMECHANICALS) ×2 IMPLANT
RELOAD STAPLE 45 3.5 BLU ETS (ENDOMECHANICALS) ×1 IMPLANT
RELOAD STAPLE TA45 3.5 REG BLU (ENDOMECHANICALS) ×3 IMPLANT
SCISSORS METZENBAUM CVD 33 (INSTRUMENTS) ×1 IMPLANT
SHEARS HARMONIC ACE PLUS 36CM (ENDOMECHANICALS) IMPLANT
SLEEVE ENDOPATH XCEL 5M (ENDOMECHANICALS) ×3 IMPLANT
SUT MNCRL 4-0 (SUTURE) ×3
SUT MNCRL 4-0 27XMFL (SUTURE) ×1
SUT VICRYL 0 AB UR-6 (SUTURE) ×3 IMPLANT
SUTURE MNCRL 4-0 27XMF (SUTURE) ×1 IMPLANT
TRAY FOLEY W/METER SILVER 16FR (SET/KITS/TRAYS/PACK) ×3 IMPLANT
TROCAR XCEL BLUNT TIP 100MML (ENDOMECHANICALS) ×3 IMPLANT
TROCAR XCEL NON-BLD 5MMX100MML (ENDOMECHANICALS) ×6 IMPLANT
TUBING INSUFFLATOR HI FLOW (MISCELLANEOUS) ×3 IMPLANT

## 2016-11-06 NOTE — ED Notes (Signed)
Bilateral upper abdominal pain X 4-5 days. Fatigue and nausea. Pt ambulatory to room. Pt alert and oriented X4, active, cooperative, pt in NAD. RR even and unlabored, color WNL.

## 2016-11-06 NOTE — Anesthesia Preprocedure Evaluation (Addendum)
Anesthesia Evaluation  Patient identified by MRN, date of birth, ID band Patient awake    Reviewed: Allergy & Precautions, NPO status , Patient's Chart, lab work & pertinent test results  Airway Mallampati: II  TM Distance: >3 FB     Dental  (+) Chipped   Pulmonary asthma , Current Smoker,    Pulmonary exam normal        Cardiovascular hypertension, Pt. on medications Normal cardiovascular exam     Neuro/Psych negative neurological ROS  negative psych ROS   GI/Hepatic Neg liver ROS, Acute appendix   Endo/Other  diabetes, Well Controlled  Renal/GU negative Renal ROS  negative genitourinary   Musculoskeletal negative musculoskeletal ROS (+)   Abdominal Normal abdominal exam  (+)   Peds negative pediatric ROS (+)  Hematology negative hematology ROS (+)   Anesthesia Other Findings   Reproductive/Obstetrics                            Anesthesia Physical Anesthesia Plan  ASA: II and emergent  Anesthesia Plan: General   Post-op Pain Management:    Induction: Intravenous, Rapid sequence and Cricoid pressure planned  Airway Management Planned: Oral ETT  Additional Equipment:   Intra-op Plan:   Post-operative Plan: Extubation in OR  Informed Consent: I have reviewed the patients History and Physical, chart, labs and discussed the procedure including the risks, benefits and alternatives for the proposed anesthesia with the patient or authorized representative who has indicated his/her understanding and acceptance.   Dental advisory given  Plan Discussed with: CRNA and Surgeon  Anesthesia Plan Comments:         Anesthesia Quick Evaluation

## 2016-11-06 NOTE — ED Notes (Signed)
Pt back from ultrasound.

## 2016-11-06 NOTE — ED Triage Notes (Signed)
Pt presents to ED c/o mid upper abd pain with nausea and fatigue since last night. " It feels like I am going to vomit but I haven't yet"; described as constant.

## 2016-11-06 NOTE — H&P (Addendum)
Jocelyn Perry is an 50 y.o. female.   Chief Complaint: abdominal pain, nausea HPI:  50 year old female who comes in today with a complaint of 1 day of extreme abdominal pain with nausea. The patient states she did have some abdominal discomfort for the past few days but just over the last days she's began to have significant pain in the periumbilical area. Patient states that it seemed to improve slightly and then she woke up this morning with increased pain that is more in the lower abdomen and nausea as well as some loose stools. Patient states that the pain continued to increase and removed from the mid abdomen to the lower mid abdomen. The patient states that the pain was about 8-9 out of 10 pain but after having the pain medicine is about a 4 out of 10 at this time. Patient states she also felt some fevers and chills though she did not take her temperature at home. Patient denies any shortness of breath wheezing, chest pain, back pain, constipation, dysuria or hematuria.  Past Medical History:  Diagnosis Date  . Asthma 11/06/2016  . High cholesterol   . Hypertension   . Sinus infection    Past Surgical History:  Procedure Laterality Date  . KNEE SURGERY Right   . NASAL SINUS SURGERY    . TUBAL LIGATION     Family History  Problem Relation Age of Onset  . Diabetes Mother   . Cancer Mother     Mesothelioma  . Hypertension Father   . Stroke Sister   . Cancer Paternal Uncle     Brain cancer   Social History   Social History  . Marital status: Divorced    Spouse name: N/A  . Number of children: N/A  . Years of education: N/A   Social History Main Topics  . Smoking status: Current Every Day Smoker    Packs/day: 0.50    Years: 30.00    Types: Cigarettes  . Smokeless tobacco: Never Used  . Alcohol use No  . Drug use: No  . Sexual activity: Yes    Birth control/ protection: Surgical   Other Topics Concern  . None   Social History Narrative  . None   No current  facility-administered medications for this encounter.   Current Outpatient Prescriptions:  .  albuterol (PROVENTIL HFA;VENTOLIN HFA) 108 (90 Base) MCG/ACT inhaler, Inhale 2 puffs into the lungs every 6 (six) hours as needed., Disp: , Rfl:  .  hydrochlorothiazide (MICROZIDE) 12.5 MG capsule, Take 12.5 mg by mouth daily., Disp: , Rfl:  .  ibuprofen (ADVIL,MOTRIN) 200 MG tablet, Take 200 mg by mouth every 6 (six) hours as needed for pain., Disp: , Rfl:  No Active Allergies   (Not in a hospital admission)  Results for orders placed or performed during the hospital encounter of 11/06/16 (from the past 48 hour(s))  Lipase, blood     Status: None   Collection Time: 11/06/16  2:07 PM  Result Value Ref Range   Lipase 19 11 - 51 U/L  Comprehensive metabolic panel     Status: Abnormal   Collection Time: 11/06/16  2:07 PM  Result Value Ref Range   Sodium 138 135 - 145 mmol/L   Potassium 4.2 3.5 - 5.1 mmol/L   Chloride 103 101 - 111 mmol/L   CO2 26 22 - 32 mmol/L   Glucose, Bld 108 (H) 65 - 99 mg/dL   BUN 10 6 - 20 mg/dL   Creatinine, Ser  0.87 0.44 - 1.00 mg/dL   Calcium 9.7 8.9 - 10.3 mg/dL   Total Protein 8.4 (H) 6.5 - 8.1 g/dL   Albumin 4.4 3.5 - 5.0 g/dL   AST 19 15 - 41 U/L   ALT 14 14 - 54 U/L   Alkaline Phosphatase 121 38 - 126 U/L   Total Bilirubin 0.7 0.3 - 1.2 mg/dL   GFR calc non Af Amer >60 >60 mL/min   GFR calc Af Amer >60 >60 mL/min    Comment: (NOTE) The eGFR has been calculated using the CKD EPI equation. This calculation has not been validated in all clinical situations. eGFR's persistently <60 mL/min signify possible Chronic Kidney Disease.    Anion gap 9 5 - 15  CBC     Status: Abnormal   Collection Time: 11/06/16  2:07 PM  Result Value Ref Range   WBC 18.1 (H) 3.6 - 11.0 K/uL   RBC 5.99 (H) 3.80 - 5.20 MIL/uL   Hemoglobin 16.3 (H) 12.0 - 16.0 g/dL   HCT 50.0 (H) 35.0 - 47.0 %   MCV 83.4 80.0 - 100.0 fL   MCH 27.3 26.0 - 34.0 pg   MCHC 32.7 32.0 - 36.0 g/dL    RDW 14.1 11.5 - 14.5 %   Platelets 382 150 - 440 K/uL  Urinalysis complete, with microscopic     Status: Abnormal   Collection Time: 11/06/16  2:07 PM  Result Value Ref Range   Color, Urine YELLOW (A) YELLOW   APPearance CLEAR (A) CLEAR   Glucose, UA NEGATIVE NEGATIVE mg/dL   Bilirubin Urine NEGATIVE NEGATIVE   Ketones, ur NEGATIVE NEGATIVE mg/dL   Specific Gravity, Urine 1.018 1.005 - 1.030   Hgb urine dipstick NEGATIVE NEGATIVE   pH 5.0 5.0 - 8.0   Protein, ur NEGATIVE NEGATIVE mg/dL   Nitrite NEGATIVE NEGATIVE   Leukocytes, UA NEGATIVE NEGATIVE   RBC / HPF 0-5 0 - 5 RBC/hpf   WBC, UA 0-5 0 - 5 WBC/hpf   Bacteria, UA RARE (A) NONE SEEN   Squamous Epithelial / LPF 0-5 (A) NONE SEEN   Mucous PRESENT   Troponin I     Status: None   Collection Time: 11/06/16  2:07 PM  Result Value Ref Range   Troponin I <0.03 <0.03 ng/mL  Lactic acid, plasma     Status: None   Collection Time: 11/06/16  3:11 PM  Result Value Ref Range   Lactic Acid, Venous 1.2 0.5 - 1.9 mmol/L   Ct Abdomen Pelvis W Contrast  Result Date: 11/06/2016 CLINICAL DATA:  Acute onset of epigastric abdominal pain and nausea. Initial encounter. EXAM: CT ABDOMEN AND PELVIS WITH CONTRAST TECHNIQUE: Multidetector CT imaging of the abdomen and pelvis was performed using the standard protocol following bolus administration of intravenous contrast. CONTRAST:  148m ISOVUE-300 IOPAMIDOL (ISOVUE-300) INJECTION 61% COMPARISON:  Right upper quadrant ultrasound performed earlier today at 3:32 p.m. FINDINGS: Lower chest: The visualized lung bases are grossly clear. The visualized portions of the mediastinum are unremarkable. Hepatobiliary: The liver is unremarkable in appearance. The gallbladder is unremarkable in appearance. The common bile duct remains normal in caliber. Pancreas: The pancreas is within normal limits. Spleen: The spleen is unremarkable in appearance. Adrenals/Urinary Tract: The adrenal glands are unremarkable in  appearance. The kidneys are within normal limits. There is no evidence of hydronephrosis. No renal or ureteral stones are identified. No perinephric stranding is seen. Stomach/Bowel: There is dilatation of the distal 5 cm of the appendix to 1.1  cm in diameter, containing a small amount of stool, with surrounding soft tissue inflammation, concerning for acute appendicitis. This is noted at the midline. The more proximal appendix is unremarkable in appearance. There is no evidence of perforation or abscess formation at this time. The stomach is unremarkable in appearance. The small bowel is within normal limits. Mild diverticulosis is noted at the proximal sigmoid colon. The colon is otherwise unremarkable. Vascular/Lymphatic: Scattered calcification is seen along the abdominal aorta and its branches. The abdominal aorta is otherwise grossly unremarkable. The inferior vena cava is grossly unremarkable. No retroperitoneal lymphadenopathy is seen. No pelvic sidewall lymphadenopathy is identified. Reproductive: The bladder is mildly distended and within normal limits. The uterus is grossly unremarkable in appearance. The ovaries are relatively symmetric. No suspicious adnexal masses are seen. Other: No additional soft tissue abnormalities are seen. Musculoskeletal: No acute osseous abnormalities are identified. The visualized musculature is unremarkable in appearance. IMPRESSION: 1. Dilatation of the distal 5 cm of the appendix to 1.1 cm in diameter, containing a small amount of stool, with surrounding soft tissue inflammation, concerning for acute appendicitis. This is seen at the midline. The more proximal appendix is unremarkable in appearance. No evidence of perforation or abscess formation at this time. 2. Mild diverticulosis along the proximal sigmoid colon, without evidence of diverticulitis. 3. Scattered aortic atherosclerosis. These results were called by telephone at the time of interpretation on 11/06/2016 at  6:42 pm to Dr. Hinda Kehr, who verbally acknowledged these results. Electronically Signed   By: Garald Balding M.D.   On: 11/06/2016 18:45   US Abdomen Limited Ruq  Result Date: 11/06/2016 CLINICAL DATA:  Right upper quadrant and epigastric pain with nausea for approximate 24 hours. EXAM: US ABDOMEN LIMITED - RIGHT UPPER QUADRANT COMPARISON:  CT, 03/04/2006 FINDINGS: Gallbladder: No gallstones or wall thickening visualized. No sonographic Murphy sign noted by sonographer. Common bile duct: Diameter: 4.2 mm Liver: No focal lesion identified. Within normal limits in parenchymal echogenicity. IMPRESSION: Normal right upper quadrant ultrasound. Electronically Signed   By: Lajean Manes M.D.   On: 11/06/2016 16:05    Review of Systems  Constitutional: Positive for chills, fever and malaise/fatigue. Negative for diaphoresis and weight loss.  HENT: Negative for congestion and sore throat.   Respiratory: Negative for cough, hemoptysis, sputum production, shortness of breath and wheezing.   Cardiovascular: Negative for chest pain, palpitations, claudication and leg swelling.  Gastrointestinal: Positive for abdominal pain, nausea and vomiting. Negative for constipation, diarrhea and heartburn.  Genitourinary: Negative for dysuria, flank pain, frequency, hematuria and urgency.  Musculoskeletal: Negative for back pain, falls, joint pain, myalgias and neck pain.  Skin: Negative for itching and rash.  Neurological: Negative for dizziness, loss of consciousness and weakness.  Psychiatric/Behavioral: Negative for depression. The patient is not nervous/anxious.   All other systems reviewed and are negative.   Blood pressure 135/86, pulse (!) 110, temperature 98.6 F (37 C), temperature source Oral, resp. rate 18, height _0  (1.676 m), SpO2 100 %. Physical Exam  Vitals reviewed. Constitutional: She is oriented to person, place, and time. She appears well-developed and well-nourished. No distress.  HENT:   Head: Normocephalic and atraumatic.  Right Ear: External ear normal.  Left Ear: External ear normal.  Nose: Nose normal.  Mouth/Throat: Oropharynx is clear and moist. No oropharyngeal exudate.  Eyes: Conjunctivae and EOM are normal. Pupils are equal, round, and reactive to light. No scleral icterus.  Neck: Normal range of motion. Neck supple. No tracheal deviation present.  Cardiovascular: Normal rate, regular rhythm, normal heart sounds and intact distal pulses.  Exam reveals no gallop and no friction rub.   No murmur heard. Respiratory: Effort normal and breath sounds normal. No respiratory distress. She has no wheezes. She has no rales. She exhibits no tenderness.  GI: Soft. Bowel sounds are normal. She exhibits distension. There is tenderness. There is no rebound and no guarding.  Obese, soft, tender in periumbilical area and bilateral lower quadrants, most tender just subumbilical in midline  Musculoskeletal: Normal range of motion. She exhibits no edema, tenderness or deformity.  Neurological: She is alert and oriented to person, place, and time. No cranial nerve deficit.  Skin: Skin is warm and dry. No rash noted. No erythema. No pallor.  Psychiatric: She has a normal mood and affect. Her behavior is normal. Judgment and thought content normal.     Assessment/Plan 50 year old female with acute appendicitis. I have reviewed her past medical history which is positive for early diabetes mellitus controlled by diet and hypertension which is well-controlled with hydrochlorothiazide and asthma where she uses her inhaler approximately once a month. I have also personally reviewed her laboratory values which is pertinent for an elevated white blood cell count 18.  I have also reviewed her images from both her ultrasound which showed a normal gallbladder without stones and her CT scan images which did show a very long appendix with the tip ending in the subumbilical area that is thickened and  inflamed with some fluid surrounding. I also reviewed the radiology read as above.    The risks, benefits, complications, treatment options, and expected outcomes were discussed with the patient. The treatment of antibiotics alone was discussed giving a 20% chance that this could fail and surgery would be necessary.  Also discussed continuing to the operating room for Laparoscopic Appendectomy.  The possibilities of  bleeding, recurrent infection, perforation of viscus, finding a normal appendix, the need for additional procedures, failure to diagnose a condition, conversion to open procedure, potential to leave a drain and creating a complication requiring transfusion or further operations were discussed. The patient was given the opportunity to ask questions and have them answered.  Patient would like to proceed with Laparoscopic Appendectomy and consent was obtained.     Hubbard Robinson, MD 11/06/2016, 8:26 PM

## 2016-11-06 NOTE — ED Notes (Signed)
Patient transported to Ultrasound 

## 2016-11-06 NOTE — Op Note (Signed)
Appendectomy, Lap, Procedure Note  Indications: The patient presented with a history of right-sided abdominal pain. A CT scan revealed findings consistent with acute appendicitis.  Pre-operative Diagnosis: Acute appendicitis with generalized peritonitis  Post-operative Diagnosis: Same  Surgeon: Gladis Riffleatherine L Loflin   Assistants: none  Anesthesia: General endotracheal anesthesia  ASA Class: 2  Procedure Details  The patient was seen again in the Holding Room. The risks, benefits, complications, treatment options, and expected outcomes were discussed with the patient and/or family. The possibilities of reaction to medication, pulmonary aspiration, perforation of viscus, bleeding, recurrent infection, finding a normal appendix, the need for additional procedures, failure to diagnose a condition, and creating a complication requiring transfusion or operation were discussed. There was concurrence with the proposed plan and informed consent was obtained. The site of surgery was properly noted/marked. The patient was taken to Operating Room, identified as Jocelyn Perry and the procedure verified as Appendectomy. A Time Out was held and the above information confirmed.  The patient was placed in the supine position and general anesthesia was induced, along with placement of orogastric tube, SCDs, and a Foley catheter. The abdomen was prepped and draped in a sterile fashion. A one centimeter infraumbilical incision was made and the peritoneal cavity was accessed using the Hasson technique. The subcutaneous tissue was dissected with a combination of blunt and sharp dissection until the anterior abdominal fascia was identified. The anterior abdominal fascia was grasped with kocher clamps and elevated up.  It was then incised with a 15 blade scalpel and figure of eight 0-vicryl stitches placed. A Hasson trochar was then placed through the incision into the abdominal cavity and secured with the sutures.  The  abdomen was then insufflated with any complications.   Additional 5 mm cannulas then placed in the left lower quadrant of the abdomen and half way between the umbilicus and pubic symphysis under direct vision. A careful evaluation of the entire abdomen was carried out. The patient was placed in Trendelenburg and left lateral decubitus position. The small intestines were retracted in the cephalad and left lateral direction away from the pelvis and right lower quadrant. The patient was found to have an enlarged and inflamed appendix that was extending into the pelvis. There was no evidence of perforation.  The appendix was carefully dissected. A window was made in the mesoappendix at the base of the appendix. A vascular load endo-GIA was fired across the mesoappendix. The appendix was divided at its base using an endo-GIA stapler. Minimal appendiceal stump was left in place. There was no evidence of bleeding, leakage, or complication after division of the appendix. Irrigation was also performed and irrigate suctioned from the abdomen as well.  The umbilical port site was closed using the previously placed figure of eight sutures at the level of the fascia. The umbilical incision and trocar site skin wounds were closed 4-0 Monocryl.  Sterile glue was applied as a dressing to the skin.   Instrument, sponge, and needle counts were correct at the conclusion of the case.   Findings: The appendix was found to be inflamed. There were not signs of necrosis.  There was not perforation. There was not abscess formation.  Estimated Blood Loss:  Minimal         Drains: none         Total IV Fluids: 1000mL         Specimens: appendix         Complications:  None; patient tolerated the procedure  well.         Disposition: PACU - hemodynamically stable.         Condition: stable

## 2016-11-06 NOTE — ED Provider Notes (Addendum)
[facility] Emergency Department Provider Note  ____________________________________________   First MD Initiated Contact with Patient 11/06/16 1455     (approximate)  I have reviewed the triage vital signs and the nursing notes.   HISTORY  Chief Complaint Abdominal Pain (upper quadrants); Nausea; and Fatigue    HPI Jocelyn Perry is a 50 y.o. female with no significant past medical history and with the surgical history of tubal ligation who presents for evaluation of several days of worsening upper abdominal aching and dull pain with nausea.  She states that it is been on and off for a few days but that it started last night severe in intensity and acute onset a couple of hours after eating dinner.  It has been persistent since that time.  Is located mostly in the middle of her upper abdomen (she indicates her epigastrium) but also a little bit on the right side.  She also feels extremely tired and fatigued.  She describes the symptoms as severe and nothing makes it better or worse.  She has not had any appetite today and has not vomited although the nausea has been persistent.  She denies fever/chills, chest pain, shortness of breath, dysuria.  She had one episode of loose stool but she said that it was not watery, just soft.  She has had no vaginal symptoms.  She has not experienced anything like the symptoms in the past.  She has not had her appendix nor gallbladder removed.  Past Medical History:  Diagnosis Date  . Diabetes mellitus without complication (HCC)   . High cholesterol   . Sinus infection     There are no active problems to display for this patient.   Past Surgical History:  Procedure Laterality Date  . KNEE SURGERY Right   . NASAL SINUS SURGERY    . TUBAL LIGATION      Prior to Admission medications   Medication Sig Start Date End Date Taking? Authorizing Provider  albuterol (PROVENTIL HFA;VENTOLIN HFA) 108 (90 Base) MCG/ACT inhaler  Inhale 2 puffs into the lungs every 6 (six) hours as needed. 07/17/16 07/17/17 Yes Historical Provider, MD  hydrochlorothiazide (MICROZIDE) 12.5 MG capsule Take 12.5 mg by mouth daily.   Yes Historical Provider, MD  ibuprofen (ADVIL,MOTRIN) 200 MG tablet Take 200 mg by mouth every 6 (six) hours as needed for pain.    Historical Provider, MD    Allergies Patient has no known allergies.  Family History  Problem Relation Age of Onset  . Diabetes Mother   . Cancer Mother   . Hypertension Father   . Stroke Sister     Social History Social History  Substance Use Topics  . Smoking status: Current Every Day Smoker    Packs/day: 0.50    Years: 12.00    Types: Cigarettes  . Smokeless tobacco: Never Used  . Alcohol use No    Review of Systems Constitutional: No fever/chills Eyes: No visual changes. ENT: No sore throat. Cardiovascular: Denies chest pain. Respiratory: Denies shortness of breath. Gastrointestinal: Upper abdominal pain most notable in the middle and a little bit on the right side with nausea but no vomiting.  One episode of loose stool Genitourinary: Negative for dysuria. Musculoskeletal: Negative for back pain. Skin: Negative for rash. Neurological: Negative for headaches, focal weakness or numbness.  10-point ROS otherwise negative.  ____________________________________________   PHYSICAL EXAM:  VITAL SIGNS: ED Triage Vitals  Enc Vitals Group     BP 11/06/16 1351 (!) 178/113  Pulse Rate 11/06/16 1351 (!) 112     Resp --      Temp 11/06/16 1351 98.3 F (36.8 C)     Temp Source 11/06/16 1351 Oral     SpO2 11/06/16 1351 98 %     Weight --      Height 11/06/16 1353 5\' 6"  (1.676 m)     Head Circumference --      Peak Flow --      Pain Score 11/06/16 1431 6     Pain Loc --      Pain Edu? --      Excl. in GC? --     Constitutional: Alert and oriented. Well appearing and in no acute distress But does appear uncomfortable Eyes: Conjunctivae are normal.  PERRL. EOMI. Head: Atraumatic. Nose: No congestion/rhinnorhea. Mouth/Throat: Mucous membranes are moist.  Oropharynx non-erythematous. Neck: No stridor.  No meningeal signs.   Cardiovascular: Normal rate, regular rhythm. Good peripheral circulation. Grossly normal heart sounds. Respiratory: Normal respiratory effort.  No retractions. Lungs CTAB. Gastrointestinal: Soft and no rebound or guarding.  She has mild tenderness to palpation of the epigastrium, right upper quadrant, and supraumbilical region.  She has no lower abdominal tenderness to palpation on either side.  No distention. Musculoskeletal: No lower extremity tenderness nor edema. No gross deformities of extremities. Neurologic:  Normal speech and language. No gross focal neurologic deficits are appreciated.  Skin:  Skin is warm, dry and intact. No rash noted. Psychiatric: Mood and affect are normal. Speech and behavior are normal.  ____________________________________________   LABS (all labs ordered are listed, but only abnormal results are displayed)  Labs Reviewed  COMPREHENSIVE METABOLIC PANEL - Abnormal; Notable for the following:       Result Value   Glucose, Bld 108 (*)    Total Protein 8.4 (*)    All other components within normal limits  CBC - Abnormal; Notable for the following:    WBC 18.1 (*)    RBC 5.99 (*)    Hemoglobin 16.3 (*)    HCT 50.0 (*)    All other components within normal limits  URINALYSIS COMPLETEWITH MICROSCOPIC ([facility] ONLY) - Abnormal; Notable for the following:    Color, Urine YELLOW (*)    APPearance CLEAR (*)    Bacteria, UA RARE (*)    Squamous Epithelial / LPF 0-5 (*)    All other components within normal limits  LIPASE, BLOOD  TROPONIN I  LACTIC ACID, PLASMA   ____________________________________________  EKG  ED ECG REPORT I, Jianni Batten, the attending physician, personally viewed and interpreted this ECG.  Date: 11/06/2016 EKG Time: 13:59 Rate: 110 Rhythm: Sinus  tachycardia QRS Axis: normal Intervals: normal ST/T Wave abnormalities: normal Conduction Disturbances: none Narrative Interpretation: unremarkable  ____________________________________________  RADIOLOGY   Ct Abdomen Pelvis W Contrast  Result Date: 11/06/2016 CLINICAL DATA:  Acute onset of epigastric abdominal pain and nausea. Initial encounter. EXAM: CT ABDOMEN AND PELVIS WITH CONTRAST TECHNIQUE: Multidetector CT imaging of the abdomen and pelvis was performed using the standard protocol following bolus administration of intravenous contrast. CONTRAST:  100mL ISOVUE-300 IOPAMIDOL (ISOVUE-300) INJECTION 61% COMPARISON:  Right upper quadrant ultrasound performed earlier today at 3:32 p.m. FINDINGS: Lower chest: The visualized lung bases are grossly clear. The visualized portions of the mediastinum are unremarkable. Hepatobiliary: The liver is unremarkable in appearance. The gallbladder is unremarkable in appearance. The common bile duct remains normal in caliber. Pancreas: The pancreas is within normal limits. Spleen: The spleen is unremarkable in  appearance. Adrenals/Urinary Tract: The adrenal glands are unremarkable in appearance. The kidneys are within normal limits. There is no evidence of hydronephrosis. No renal or ureteral stones are identified. No perinephric stranding is seen. Stomach/Bowel: There is dilatation of the distal 5 cm of the appendix to 1.1 cm in diameter, containing a small amount of stool, with surrounding soft tissue inflammation, concerning for acute appendicitis. This is noted at the midline. The more proximal appendix is unremarkable in appearance. There is no evidence of perforation or abscess formation at this time. The stomach is unremarkable in appearance. The small bowel is within normal limits. Mild diverticulosis is noted at the proximal sigmoid colon. The colon is otherwise unremarkable. Vascular/Lymphatic: Scattered calcification is seen along the abdominal aorta and  its branches. The abdominal aorta is otherwise grossly unremarkable. The inferior vena cava is grossly unremarkable. No retroperitoneal lymphadenopathy is seen. No pelvic sidewall lymphadenopathy is identified. Reproductive: The bladder is mildly distended and within normal limits. The uterus is grossly unremarkable in appearance. The ovaries are relatively symmetric. No suspicious adnexal masses are seen. Other: No additional soft tissue abnormalities are seen. Musculoskeletal: No acute osseous abnormalities are identified. The visualized musculature is unremarkable in appearance. IMPRESSION: 1. Dilatation of the distal 5 cm of the appendix to 1.1 cm in diameter, containing a small amount of stool, with surrounding soft tissue inflammation, concerning for acute appendicitis. This is seen at the midline. The more proximal appendix is unremarkable in appearance. No evidence of perforation or abscess formation at this time. 2. Mild diverticulosis along the proximal sigmoid colon, without evidence of diverticulitis. 3. Scattered aortic atherosclerosis. These results were called by telephone at the time of interpretation on 11/06/2016 at 6:42 pm to Dr. Loleta Rose, who verbally acknowledged these results. Electronically Signed   By: Roanna Raider M.D.   On: 11/06/2016 18:45   US Abdomen Limited Ruq  Result Date: 11/06/2016 CLINICAL DATA:  Right upper quadrant and epigastric pain with nausea for approximate 24 hours. EXAM: US ABDOMEN LIMITED - RIGHT UPPER QUADRANT COMPARISON:  CT, 03/04/2006 FINDINGS: Gallbladder: No gallstones or wall thickening visualized. No sonographic Murphy sign noted by sonographer. Common bile duct: Diameter: 4.2 mm Liver: No focal lesion identified. Within normal limits in parenchymal echogenicity. IMPRESSION: Normal right upper quadrant ultrasound. Electronically Signed   By: Amie Portland M.D.   On: 11/06/2016 16:05     ____________________________________________   PROCEDURES  Procedure(s) performed:   Procedures   Critical Care performed: No ____________________________________________   INITIAL IMPRESSION / ASSESSMENT AND PLAN / ED COURSE  Pertinent labs & imaging results that were available during my care of the patient were reviewed by me and considered in my medical decision making (see chart for details).  The patient has some mild sinus tachycardia at a leukocytosis of 18.  However she is very well-appearing with no obvious source of infection at this point.  The leukocytosis may represent a stress response but I am also concerned about the possibility of gallbladder disease versus appendicitis although would be an atypical presentation of appendicitis.  I will evaluate first with an ultrasound of the right upper quadrant given that her symptoms are primarily nausea and persistent upper abdominal pain in the epigastrium which was episodic briefly and now has been persistent for about 24 hours.  If this study is unrevealing we can proceed with a CT scan.  I am holding off on empiric antibiotics at this time since I still do not have a source but I  will treat with normal saline, Zofran, and morphine.  Clinical Course as of Nov 06 1909  Sun Nov 06, 2016  1600 Lactic acid is normal which is again reassuring that the patient is not septic Lactic Acid, Venous: 1.2 [CF]  1611 Unremarkable ultrasound.  We will proceed with CT scan. US Abdomen Limited RUQ [CF]  1851 Dr. Cherly Hensenhang with radiology called me to let me know that the patient has acute uncomplicated appendicitis with no evidence of rupture.  I informed the patient did have ordered Zosyn and another dose of pain medicine.  Surgery consult/admission is pending.  [CF]  1910 spoke by phone with Dr. Aleen CampiPiscoya, his relief, Dr. Orvis BrillLoflin, will come to see and admit the patient.  [CF]    Clinical Course User Index [CF] Loleta Roseory Arelene Moroni, MD     ____________________________________________  FINAL CLINICAL IMPRESSION(S) / ED DIAGNOSES  Final diagnoses:  Acute appendicitis with localized peritonitis     MEDICATIONS GIVEN DURING THIS VISIT:  Medications  piperacillin-tazobactam (ZOSYN) IVPB 3.375 g (3.375 g Intravenous New Bag/Given 11/06/16 1858)  sodium chloride 0.9 % bolus 1,000 mL (0 mLs Intravenous Stopped 11/06/16 1649)  morphine 4 MG/ML injection 4 mg (4 mg Intravenous Given 11/06/16 1510)  ondansetron (ZOFRAN) injection 4 mg (4 mg Intravenous Given 11/06/16 1510)  iopamidol (ISOVUE-300) 61 % injection 30 mL (30 mLs Oral Contrast Given 11/06/16 1625)  iopamidol (ISOVUE-300) 61 % injection 100 mL (100 mLs Intravenous Contrast Given 11/06/16 1825)  morphine 4 MG/ML injection 4 mg (4 mg Intravenous Given 11/06/16 1857)  0.9 %  sodium chloride infusion ( Intravenous New Bag/Given 11/06/16 1857)     NEW OUTPATIENT MEDICATIONS STARTED DURING THIS VISIT:  New Prescriptions   No medications on file    Modified Medications   No medications on file    Discontinued Medications   AMOXICILLIN (AMOXIL) 500 MG CAPSULE    Take 1 capsule (500 mg total) by mouth 3 (three) times daily.   BENZONATATE (TESSALON PERLES) 100 MG CAPSULE    Take 1 capsule (100 mg total) by mouth 3 (three) times daily as needed for cough.   CHLORPHENIRAMINE-HYDROCODONE (TUSSIONEX PENNKINETIC ER) 10-8 MG/5ML SUER    Take 5 mLs by mouth at bedtime as needed for cough.   FEXOFENADINE-PSEUDOEPHEDRINE (ALLEGRA-D) 60-120 MG 12 HR TABLET    Take 1 tablet by mouth 2 (two) times daily.   HYDROCHLOROTHIAZIDE (HYDRODIURIL) 25 MG TABLET    Take 1 tablet (25 mg total) by mouth daily.   HYDROCODONE-ACETAMINOPHEN (NORCO/VICODIN) 5-325 MG PER TABLET    Take 1 tablet by mouth every 6 (six) hours as needed for pain.   PRAVASTATIN (PRAVACHOL) 10 MG TABLET    Take 1 tablet (10 mg total) by mouth daily.     Note:  This document was prepared using Dragon voice recognition  software and may include unintentional dictation errors.    Loleta Roseory Walid Haig, MD 11/06/16 [id number]    Loleta Roseory Mirza Kidney, MD 11/06/16 1910

## 2016-11-06 NOTE — Anesthesia Procedure Notes (Signed)
Procedure Name: Intubation Performed by: Byron Tipping Pre-anesthesia Checklist: Patient identified, Emergency Drugs available, Suction available, Patient being monitored and Timeout performed Patient Re-evaluated:Patient Re-evaluated prior to inductionOxygen Delivery Method: Circle system utilized Preoxygenation: Pre-oxygenation with 100% oxygen Intubation Type: IV induction and Rapid sequence Laryngoscope Size: Mac and 4 Grade View: Grade II Number of attempts: 1 Placement Confirmation: ETT inserted through vocal cords under direct vision,  positive ETCO2,  CO2 detector and breath sounds checked- equal and bilateral Secured at: 22 cm Tube secured with: Tape

## 2016-11-06 NOTE — Transfer of Care (Signed)
Immediate Anesthesia Transfer of Care Note  Patient: Jocelyn Perry  Procedure(s) Performed: Procedure(s): APPENDECTOMY LAPAROSCOPIC (N/A)  Patient Location: PACU  Anesthesia Type:General  Level of Consciousness: awake and alert   Airway & Oxygen Therapy: Patient Spontanous Breathing and Patient connected to face mask oxygen  Post-op Assessment: Report given to RN  Post vital signs: Reviewed and stable  Last Vitals:  Vitals:   11/06/16 1913 11/06/16 2153  BP: 135/86 (!) 156/102  Pulse: (!) 110 (!) 110  Resp: 18 (!) 31  Temp: 37 C 36.4 C    Last Pain:  Vitals:   11/06/16 2012  TempSrc:   PainSc: 6          Complications: No apparent anesthesia complications

## 2016-11-07 ENCOUNTER — Encounter: Payer: Self-pay | Admitting: Surgery

## 2016-11-07 LAB — BASIC METABOLIC PANEL
ANION GAP: 6 (ref 5–15)
BUN: 9 mg/dL (ref 6–20)
CALCIUM: 8.2 mg/dL — AB (ref 8.9–10.3)
CO2: 24 mmol/L (ref 22–32)
Chloride: 108 mmol/L (ref 101–111)
Creatinine, Ser: 0.9 mg/dL (ref 0.44–1.00)
GFR calc Af Amer: >60 mL/min (ref >60)
GLUCOSE: 133 mg/dL — AB (ref 65–99)
Potassium: 3.6 mmol/L (ref 3.5–5.1)
SODIUM: 138 mmol/L (ref 135–145)

## 2016-11-07 LAB — CBC
HCT: 39.7 % (ref 35.0–47.0)
Hemoglobin: 13.2 g/dL (ref 12.0–16.0)
MCH: 27.7 pg (ref 26.0–34.0)
MCHC: 33.3 g/dL (ref 32.0–36.0)
MCV: 82.9 fL (ref 80.0–100.0)
Platelets: 294 10*3/uL (ref 150–440)
RBC: 4.78 MIL/uL (ref 3.80–5.20)
RDW: 14.3 % (ref 11.5–14.5)
WBC: 14.9 10*3/uL — AB (ref 3.6–11.0)

## 2016-11-07 MED ORDER — HYDROCODONE-ACETAMINOPHEN 5-325 MG PO TABS: 1.0000 | ORAL_TABLET | Freq: Four times a day (QID) | ORAL | 0 refills | Status: AC | PRN

## 2016-11-07 NOTE — Progress Notes (Signed)
Pt d/c to home today. IV removed intact.  Rx's given to pt w/all questions and concerns addressed.  D/C paperwork reviewed and education provided with all questions and concerns addressed.  Pt family at bedside for home transport. Volunteer services used for transport.

## 2016-11-07 NOTE — Discharge Summary (Signed)
  Patient ID: Jocelyn Perry MRN: [id number] DOB/AGE: 1966-04-15 50 y.o.  Admit date: 11/06/2016 Discharge date: 11/07/2016   Discharge Diagnoses:  Principal Problem:   Acute appendicitis with generalized peritonitis Active Problems:   Acute appendicitis   Procedures:Lap appy by Dr. Dionne AnoLoflin  Hospital Course: 50 year-old female with classic history of appendicitis confirmed by CT scan. Underwent an uneventful lap  Appendectomy by Dr. Orvis BrillLoflin and was kept overnight,. At the time of DC she was ambulating, tolerating PO, AVSS. Her PE revealed a female in NAD, awake alert. Abd: soft, incisions c/d/i, no peritonitis or infection. Ext : no edema and well perfused. Condition at the time of DC is stable   Disposition: 01-Home or Self Care  Discharge Instructions    Diet - low sodium heart healthy    Complete by:  As directed    Discharge instructions    Complete by:  As directed    May shower tomorrow   Increase activity slowly    Complete by:  As directed    Lifting restrictions    Complete by:  As directed    20 lbs x 6 weeks   No dressing needed    Complete by:  As directed        Medication List    TAKE these medications   albuterol 108 (90 Base) MCG/ACT inhaler Commonly known as:  PROVENTIL HFA;VENTOLIN HFA Inhale 2 puffs into the lungs every 6 (six) hours as needed.   hydrochlorothiazide 12.5 MG capsule Commonly known as:  MICROZIDE Take 12.5 mg by mouth daily.   HYDROcodone-acetaminophen 5-325 MG tablet Commonly known as:  NORCO/VICODIN Take 1-2 tablets by mouth every 6 (six) hours as needed for moderate pain.   ibuprofen 200 MG tablet Commonly known as:  ADVIL,MOTRIN Take 200 mg by mouth every 6 (six) hours as needed for pain.      Follow-up Information    [facility] Follow up in 10 day(s).   Contact information: [address] [place] [id number] [phone]           Sterling Bigiego Pabon, MD FACS

## 2016-11-07 NOTE — Anesthesia Postprocedure Evaluation (Signed)
Anesthesia Post Note  Patient: Jocelyn Perry  Procedure(s) Performed: Procedure(s) (LRB): APPENDECTOMY LAPAROSCOPIC (N/A)  Patient location during evaluation: PACU Anesthesia Type: General Level of consciousness: awake and alert and oriented Pain management: pain level controlled Vital Signs Assessment: post-procedure vital signs reviewed and stable Respiratory status: spontaneous breathing Cardiovascular status: blood pressure returned to baseline Anesthetic complications: no    Last Vitals:  Vitals:   11/06/16 2336 11/07/16 0049  BP: 125/85 (!) 142/84  Pulse: 88 94  Resp: 18 18  Temp: 37.7 C     Last Pain:  Vitals:   11/07/16 0241  TempSrc:   PainSc: Asleep                 Staton Markey

## 2016-11-08 LAB — SURGICAL PATHOLOGY

## 2016-11-17 ENCOUNTER — Encounter: Payer: Self-pay | Admitting: Surgery

## 2017-01-16 IMAGING — CT CT ABD-PELV W/ CM
2 of 5 series · 15 of 46 positions shown, 17 images · IV contrast (APPLIED)
Comparison: Right upper quadrant ultrasound performed earlier today
at [DATE] p.m.

CLINICAL DATA: Acute onset of epigastric abdominal pain and nausea.
Initial encounter.

EXAM:
CT ABDOMEN AND PELVIS WITH CONTRAST
TECHNIQUE: Multidetector CT imaging of the abdomen and pelvis was performed
using the standard protocol following bolus administration of
intravenous contrast.
CONTRAST:  100mL [id number] IOPAMIDOL ([id number]) INJECTION 61%

[Series 2: axial st · axial · 0.84mm/px · z∈[-950,-570]mm · 12 of 86 slices shown, 14 images]
[im 5/86  soft-tissue]
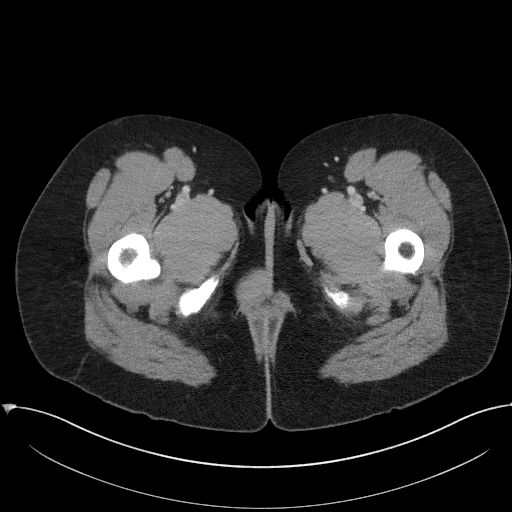
[im 5/86  bone]
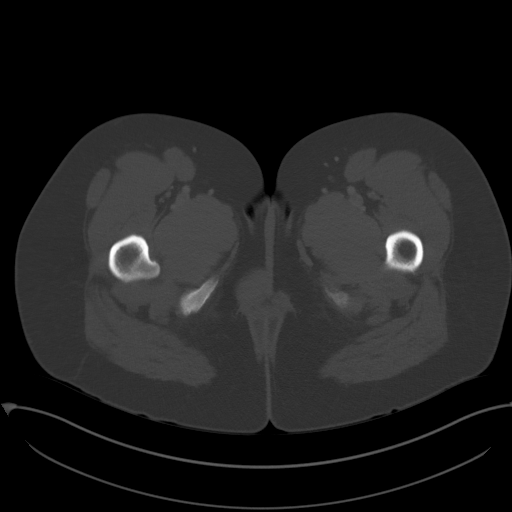
[im 14/86  soft-tissue]
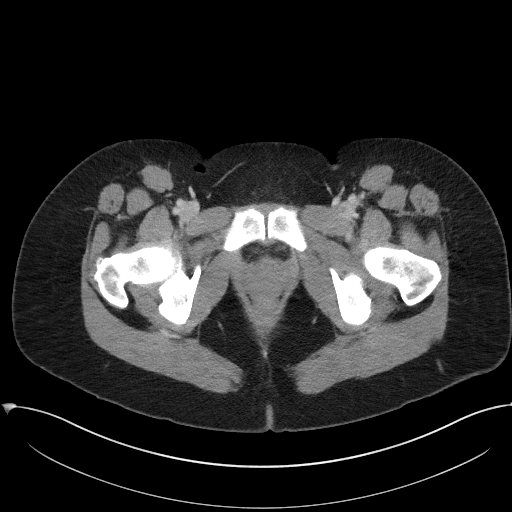
[im 18/86  soft-tissue]
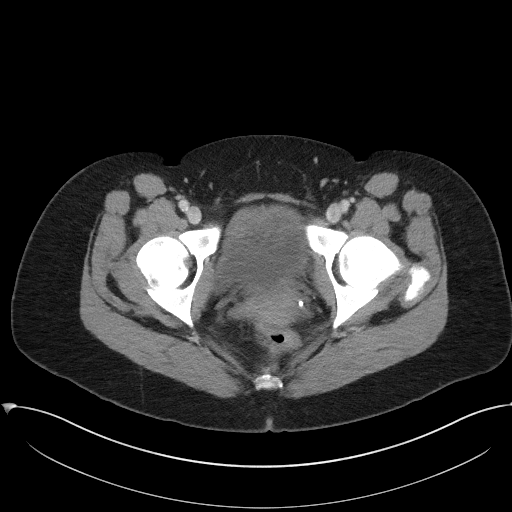
[im 27/86  soft-tissue]
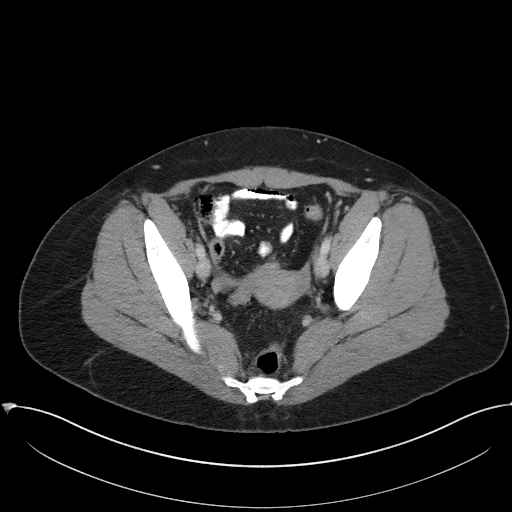
[im 32/86  soft-tissue]
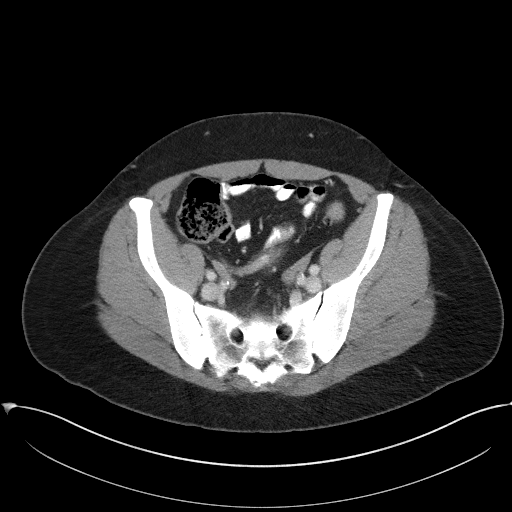
[im 41/86  soft-tissue]
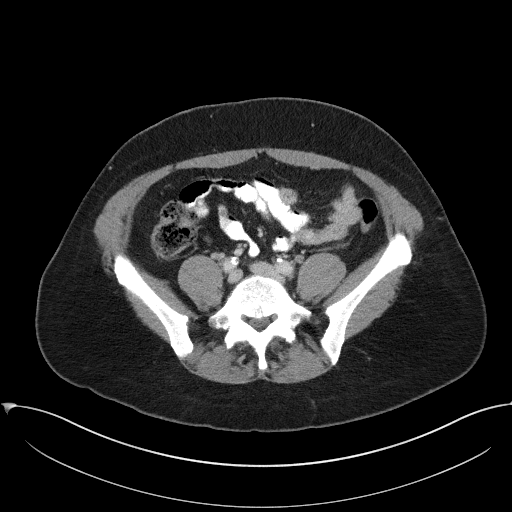
[im 45/86  soft-tissue]
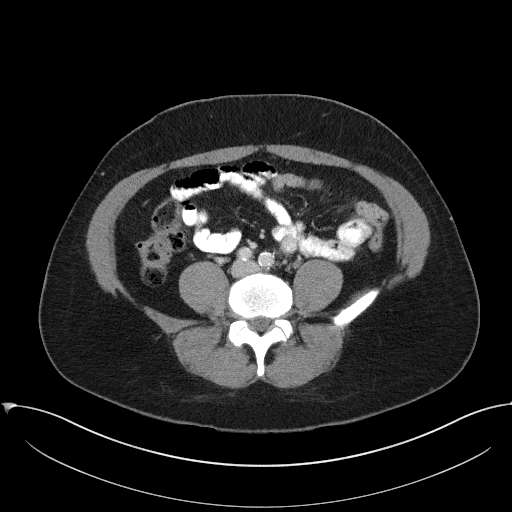
[im 54/86  soft-tissue]
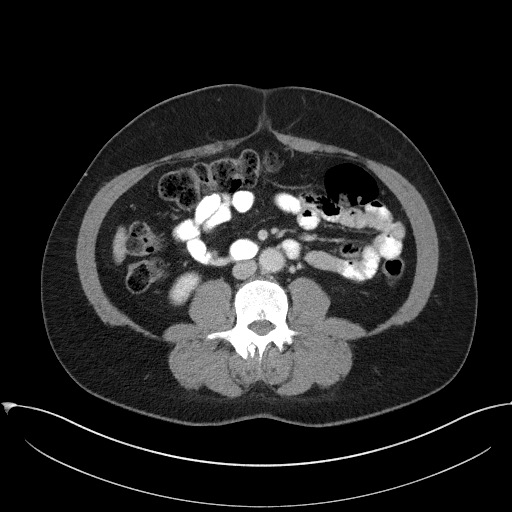
[im 59/86  soft-tissue]
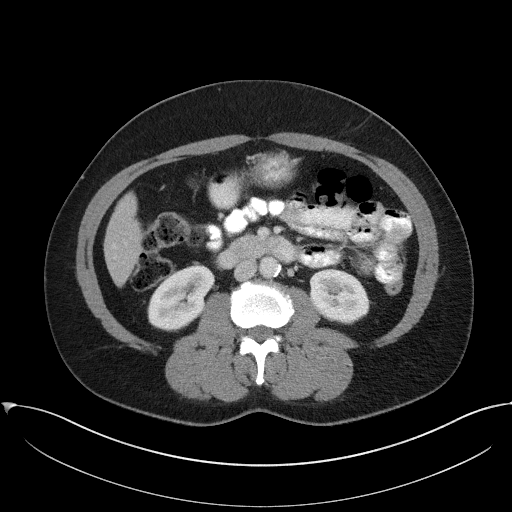
[im 59/86  bone]
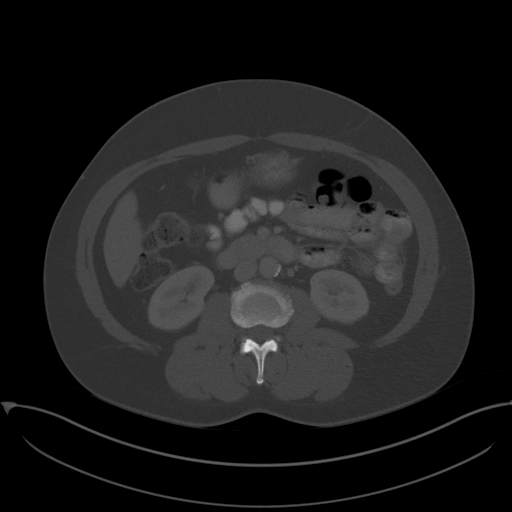
[im 68/86  soft-tissue]
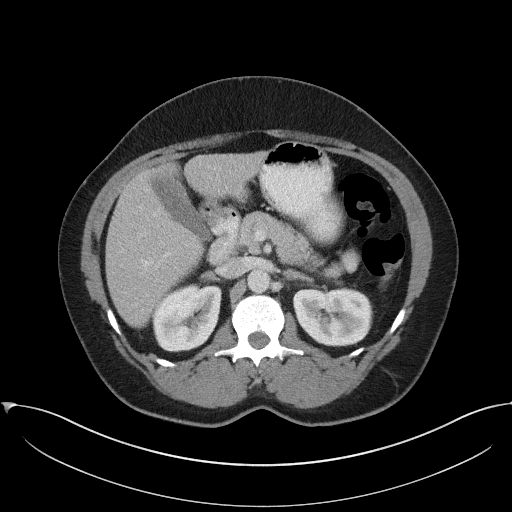
[im 72/86  soft-tissue]
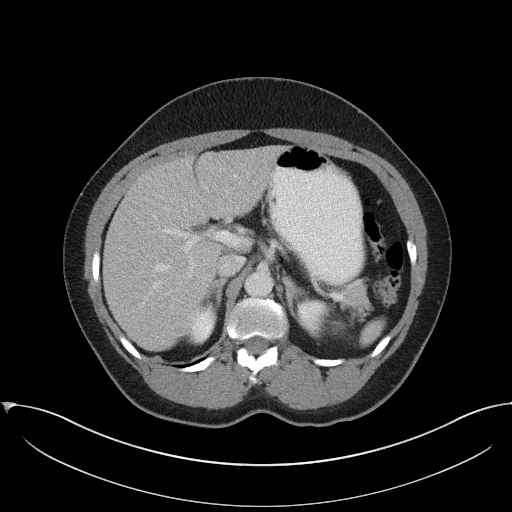
[im 81/86  soft-tissue]
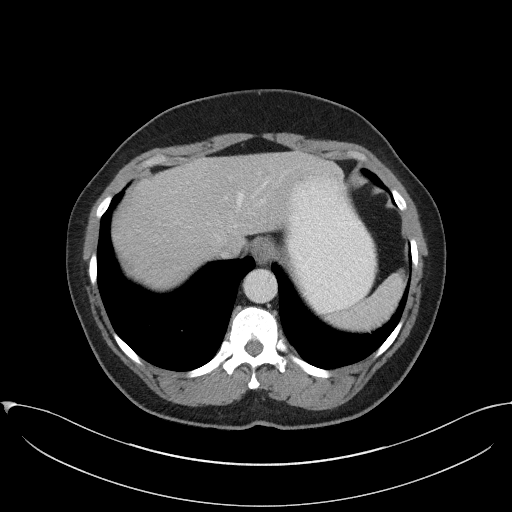

[Series 5: coronal st · coronal · 0.68mm/px · 3 of 94 slices shown]
[im 32/94  soft-tissue]
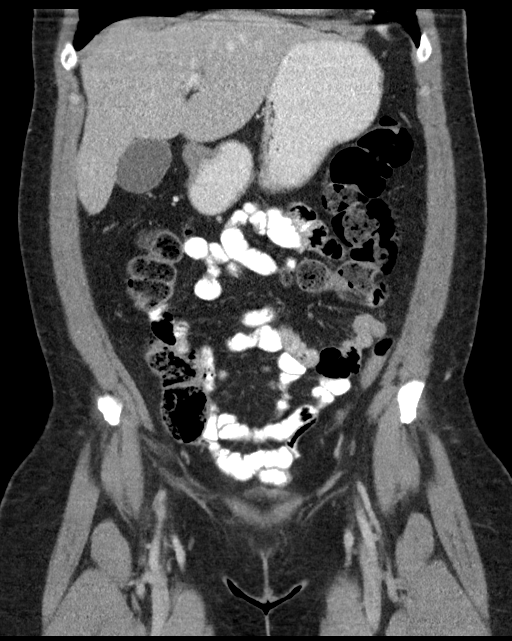
[im 42/94  soft-tissue]
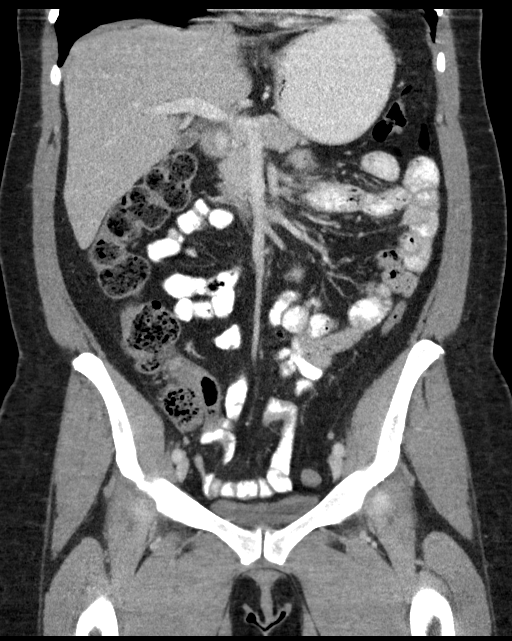
[im 52/94  soft-tissue]
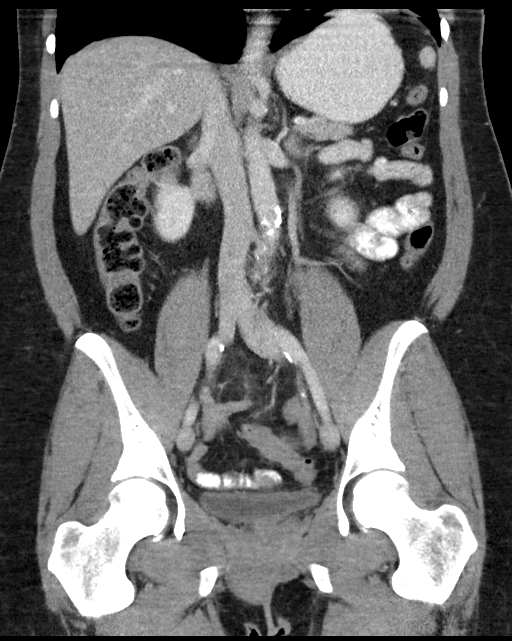

[15 of 46 positions shown; findings below may reference images not displayed]

FINDINGS: Lower chest: The visualized lung bases are grossly clear. The
visualized portions of the mediastinum are unremarkable.

Hepatobiliary: The liver is unremarkable in appearance. The
gallbladder is unremarkable in appearance. The common bile duct
remains normal in caliber.

Pancreas: The pancreas is within normal limits.

Spleen: The spleen is unremarkable in appearance.

Adrenals/Urinary Tract: The adrenal glands are unremarkable in
appearance. The kidneys are within normal limits. There is no
evidence of hydronephrosis. No renal or ureteral stones are
identified. No perinephric stranding is seen.

Stomach/Bowel: There is dilatation of the distal 5 cm of the
appendix to 1.1 cm in diameter, containing a small amount of stool,
with surrounding soft tissue inflammation, concerning for acute
appendicitis. This is noted at the midline. The more proximal
appendix is unremarkable in appearance. There is no evidence of
perforation or abscess formation at this time.

The stomach is unremarkable in appearance. The small bowel is within
normal limits. Mild diverticulosis is noted at the proximal sigmoid
colon. The colon is otherwise unremarkable.

Vascular/Lymphatic: Scattered calcification is seen along the
abdominal aorta and its branches. The abdominal aorta is otherwise
grossly unremarkable. The inferior vena cava is grossly
unremarkable. No retroperitoneal lymphadenopathy is seen. No pelvic
sidewall lymphadenopathy is identified.

Reproductive: The bladder is mildly distended and within normal
limits. The uterus is grossly unremarkable in appearance. The
ovaries are relatively symmetric. No suspicious adnexal masses are
seen.

Other: No additional soft tissue abnormalities are seen.

Musculoskeletal: No acute osseous abnormalities are identified. The
visualized musculature is unremarkable in appearance.
IMPRESSION: 1. Dilatation of the distal 5 cm of the appendix to 1.1 cm in
diameter, containing a small amount of stool, with surrounding soft
tissue inflammation, concerning for acute appendicitis. This is seen
at the midline. The more proximal appendix is unremarkable in
appearance. No evidence of perforation or abscess formation at this
time.
2. Mild diverticulosis along the proximal sigmoid colon, without
evidence of diverticulitis.
3. Scattered aortic atherosclerosis.
These results were called by telephone at the time of interpretation
on 11/06/2016 at [DATE] to Dr. LISEL JANINE, who verbally
acknowledged these results.

## 2017-10-08 IMAGING — US US ABDOMEN LIMITED
1 series · 14 of 25 positions shown · non-contrast
Comparison: CT, 03/04/2006

CLINICAL DATA: Right upper quadrant and epigastric pain with nausea
for approximate 24 hours.

EXAM:
US ABDOMEN LIMITED - RIGHT UPPER QUADRANT

[Series 1: us abdomen limited · 0.22mm/px · 14 of 63 slices shown]
[im 1/63]
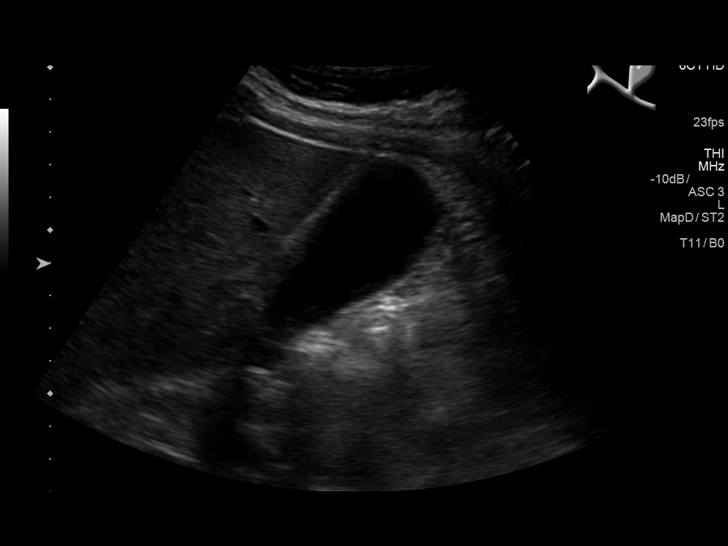
[im 6/63]
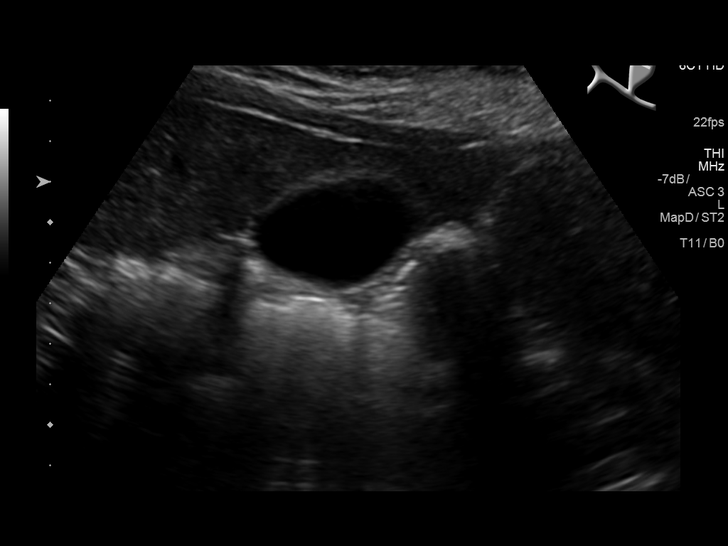
[im 11/63]
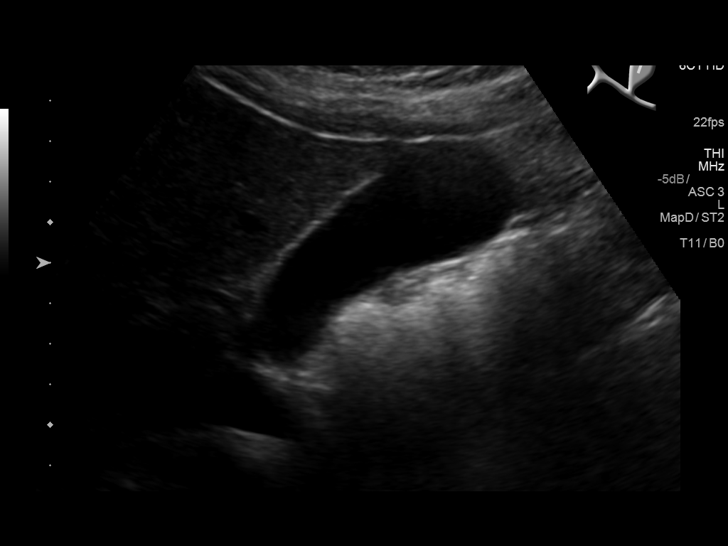
[im 16/63]
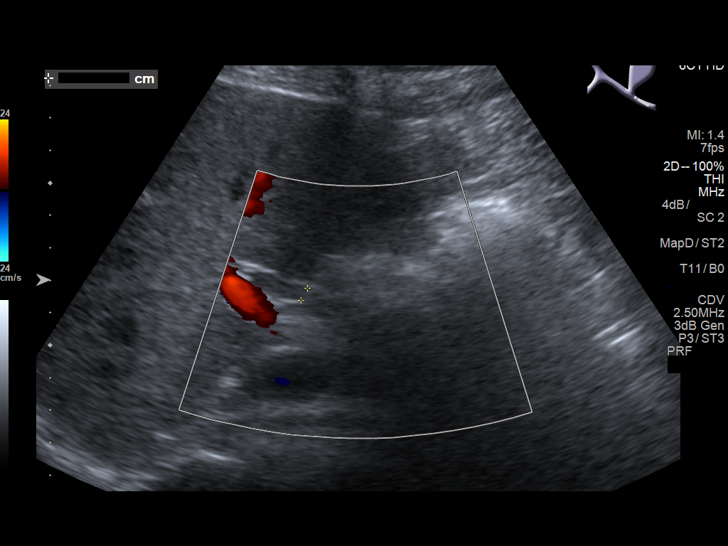
[im 21/63]
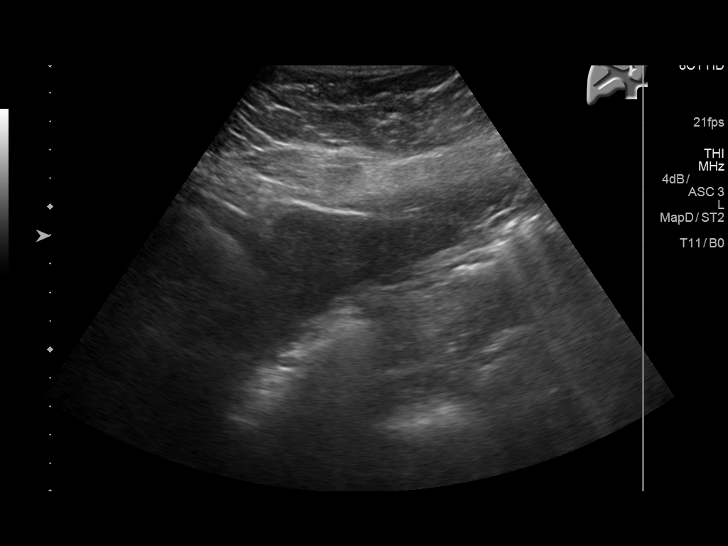
[im 24/63]
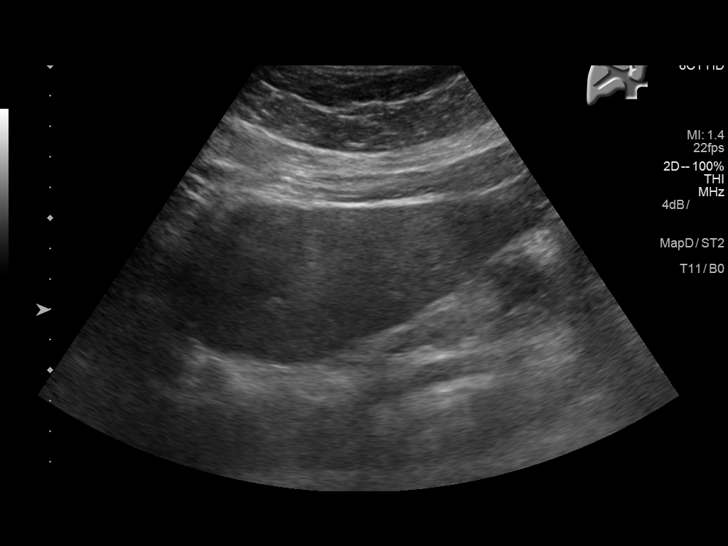
[im 29/63]
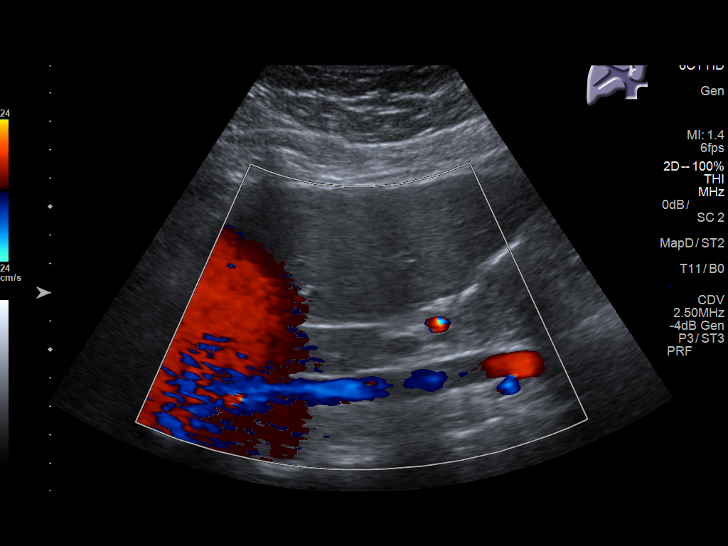
[im 34/63]
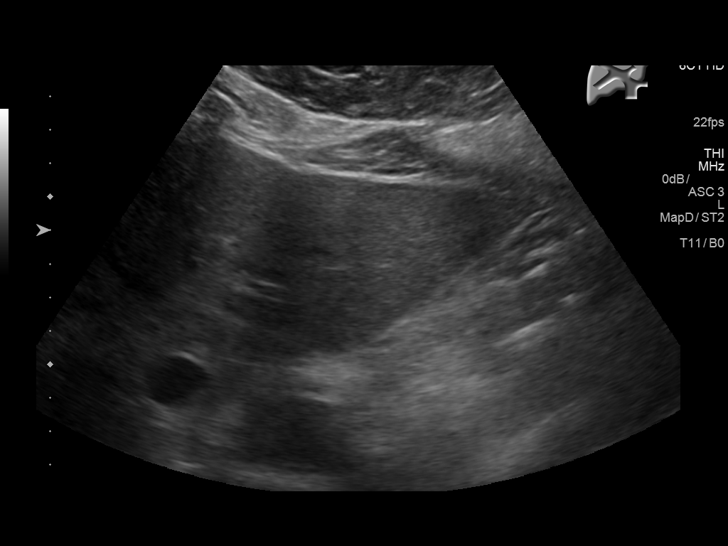
[im 39/63]
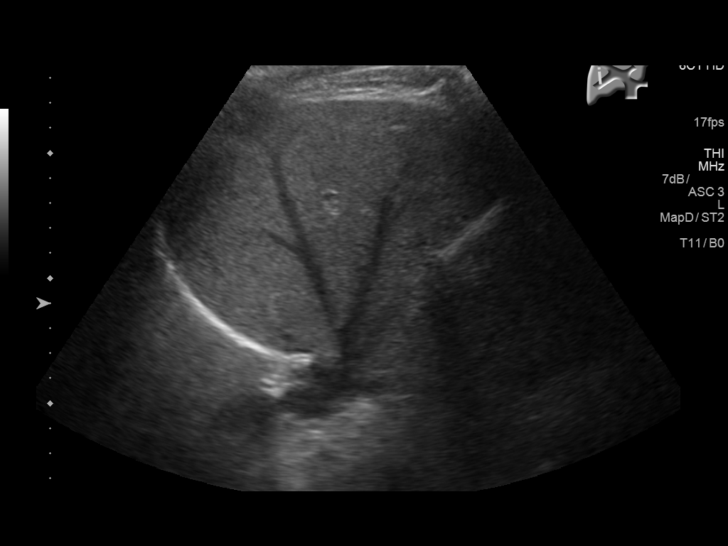
[im 42/63]
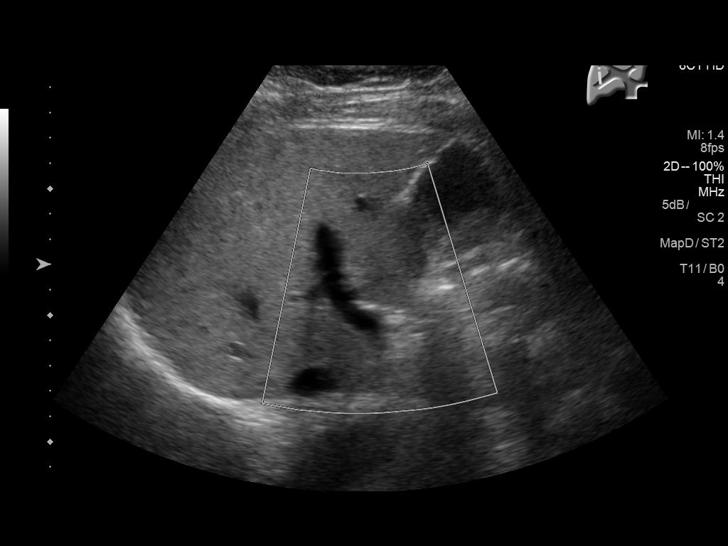
[im 47/63]
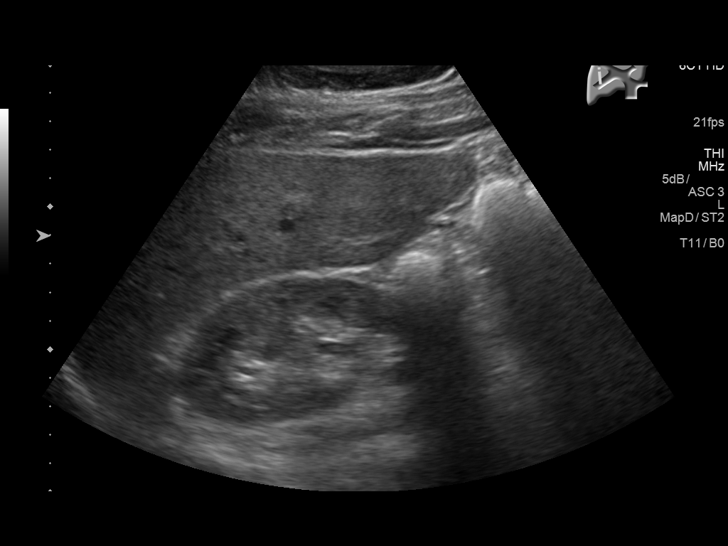
[im 52/63]
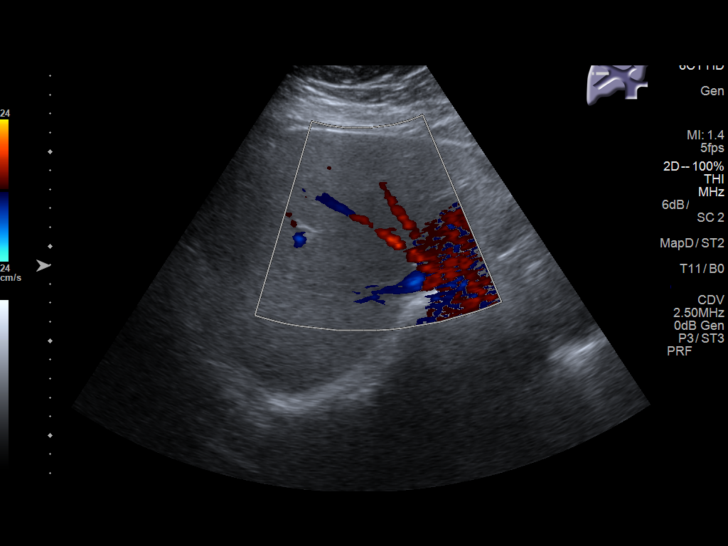
[im 57/63]
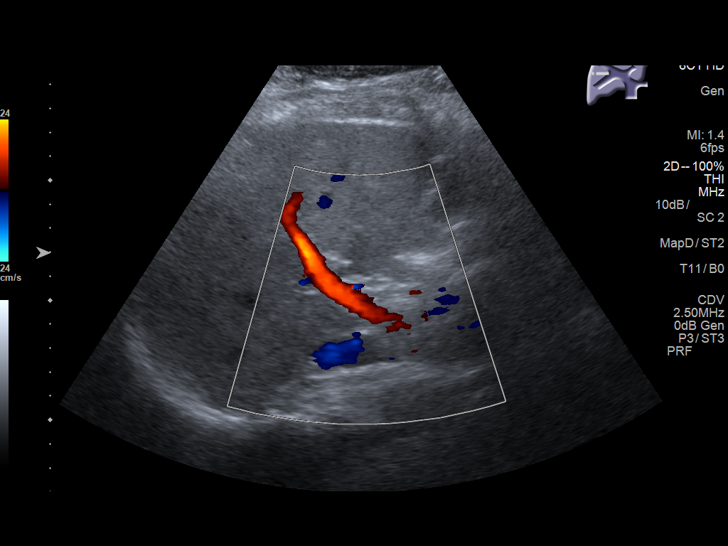
[im 63/63]
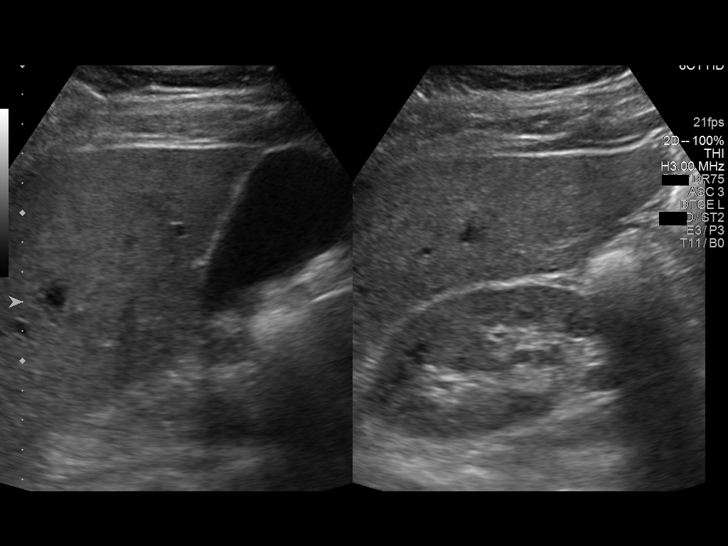

[14 of 25 positions shown; findings below may reference images not displayed]

FINDINGS: Gallbladder:

No gallstones or wall thickening visualized. No sonographic Murphy
sign noted by sonographer.

Common bile duct:

Diameter: 4.2 mm

Liver:

No focal lesion identified. Within normal limits in parenchymal
echogenicity.
IMPRESSION: Normal right upper quadrant ultrasound.
